# Patient Record
Sex: Female | Born: 1955 | Race: White | Hispanic: No | Marital: Married | Smoking: Former smoker
Health system: Southern US, Community
[De-identification: ages and names within clinical notes are randomized; demographics above are authoritative.]

## PROBLEM LIST (undated history)

## (undated) DIAGNOSIS — D0359 Melanoma in situ of other part of trunk: Secondary | ICD-10-CM

## (undated) DIAGNOSIS — K219 Gastro-esophageal reflux disease without esophagitis: Secondary | ICD-10-CM

## (undated) DIAGNOSIS — M1990 Unspecified osteoarthritis, unspecified site: Secondary | ICD-10-CM

## (undated) HISTORY — PX: CHOLECYSTECTOMY: SHX55

## (undated) HISTORY — DX: Gastro-esophageal reflux disease without esophagitis: K21.9

## (undated) HISTORY — PX: KIDNEY STONE SURGERY: SHX686

## (undated) HISTORY — DX: Melanoma in situ of other part of trunk: D03.59

---

## 2003-02-19 HISTORY — PX: CHOLECYSTECTOMY, LAPAROSCOPIC: SHX56

## 2005-02-18 HISTORY — PX: SALPINGOOPHORECTOMY: SHX82

## 2017-09-10 ENCOUNTER — Other Ambulatory Visit: Payer: Self-pay | Admitting: Advanced Practice Midwife

## 2017-09-10 DIAGNOSIS — Z1231 Encounter for screening mammogram for malignant neoplasm of breast: Secondary | ICD-10-CM

## 2017-09-12 ENCOUNTER — Ambulatory Visit (INDEPENDENT_AMBULATORY_CARE_PROVIDER_SITE_OTHER): Payer: 59

## 2017-09-12 DIAGNOSIS — Z1231 Encounter for screening mammogram for malignant neoplasm of breast: Secondary | ICD-10-CM

## 2017-09-22 ENCOUNTER — Ambulatory Visit (INDEPENDENT_AMBULATORY_CARE_PROVIDER_SITE_OTHER): Payer: 59 | Admitting: Advanced Practice Midwife

## 2017-09-22 ENCOUNTER — Encounter: Payer: Self-pay | Admitting: Advanced Practice Midwife

## 2017-09-22 DIAGNOSIS — D0359 Melanoma in situ of other part of trunk: Secondary | ICD-10-CM

## 2017-09-22 DIAGNOSIS — Z01419 Encounter for gynecological examination (general) (routine) without abnormal findings: Secondary | ICD-10-CM | POA: Diagnosis not present

## 2017-09-22 DIAGNOSIS — Z87442 Personal history of urinary calculi: Secondary | ICD-10-CM

## 2017-09-22 DIAGNOSIS — Z780 Asymptomatic menopausal state: Secondary | ICD-10-CM

## 2017-09-22 DIAGNOSIS — K649 Unspecified hemorrhoids: Secondary | ICD-10-CM

## 2017-09-25 ENCOUNTER — Encounter: Payer: Self-pay | Admitting: Advanced Practice Midwife

## 2017-09-25 DIAGNOSIS — D0359 Melanoma in situ of other part of trunk: Secondary | ICD-10-CM | POA: Insufficient documentation

## 2017-09-25 DIAGNOSIS — K649 Unspecified hemorrhoids: Secondary | ICD-10-CM | POA: Insufficient documentation

## 2017-09-25 DIAGNOSIS — Z87442 Personal history of urinary calculi: Secondary | ICD-10-CM | POA: Insufficient documentation

## 2017-09-25 DIAGNOSIS — Z780 Asymptomatic menopausal state: Secondary | ICD-10-CM | POA: Insufficient documentation

## 2017-09-25 NOTE — Patient Instructions (Signed)
Health Maintenance for Postmenopausal Women Menopause is a normal process in which your reproductive ability comes to an end. This process happens gradually over a span of months to years, usually between the ages of 22 and 9. Menopause is complete when you have missed 12 consecutive menstrual periods. It is important to talk with your health care provider about some of the most common conditions that affect postmenopausal women, such as heart disease, cancer, and bone loss (osteoporosis). Adopting a healthy lifestyle and getting preventive care can help to promote your health and wellness. Those actions can also lower your chances of developing some of these common conditions. What should I know about menopause? During menopause, you may experience a number of symptoms, such as:  Moderate-to-severe hot flashes.  Night sweats.  Decrease in sex drive.  Mood swings.  Headaches.  Tiredness.  Irritability.  Memory problems.  Insomnia.  Choosing to treat or not to treat menopausal changes is an individual decision that you make with your health care provider. What should I know about hormone replacement therapy and supplements? Hormone therapy products are effective for treating symptoms that are associated with menopause, such as hot flashes and night sweats. Hormone replacement carries certain risks, especially as you become older. If you are thinking about using estrogen or estrogen with progestin treatments, discuss the benefits and risks with your health care provider. What should I know about heart disease and stroke? Heart disease, heart attack, and stroke become more likely as you age. This may be due, in part, to the hormonal changes that your body experiences during menopause. These can affect how your body processes dietary fats, triglycerides, and cholesterol. Heart attack and stroke are both medical emergencies. There are many things that you can do to help prevent heart disease  and stroke:  Have your blood pressure checked at least every 1-2 years. High blood pressure causes heart disease and increases the risk of stroke.  If you are 53-22 years old, ask your health care provider if you should take aspirin to prevent a heart attack or a stroke.  Do not use any tobacco products, including cigarettes, chewing tobacco, or electronic cigarettes. If you need help quitting, ask your health care provider.  It is important to eat a healthy diet and maintain a healthy weight. ? Be sure to include plenty of vegetables, fruits, low-fat dairy products, and lean protein. ? Avoid eating foods that are high in solid fats, added sugars, or salt (sodium).  Get regular exercise. This is one of the most important things that you can do for your health. ? Try to exercise for at least 150 minutes each week. The type of exercise that you do should increase your heart rate and make you sweat. This is known as moderate-intensity exercise. ? Try to do strengthening exercises at least twice each week. Do these in addition to the moderate-intensity exercise.  Know your numbers.Ask your health care provider to check your cholesterol and your blood glucose. Continue to have your blood tested as directed by your health care provider.  What should I know about cancer screening? There are several types of cancer. Take the following steps to reduce your risk and to catch any cancer development as early as possible. Breast Cancer  Practice breast self-awareness. ? This means understanding how your breasts normally appear and feel. ? It also means doing regular breast self-exams. Let your health care provider know about any changes, no matter how small.  If you are 40  or older, have a clinician do a breast exam (clinical breast exam or CBE) every year. Depending on your age, family history, and medical history, it may be recommended that you also have a yearly breast X-ray (mammogram).  If you  have a family history of breast cancer, talk with your health care provider about genetic screening.  If you are at high risk for breast cancer, talk with your health care provider about having an MRI and a mammogram every year.  Breast cancer (BRCA) gene test is recommended for women who have family members with BRCA-related cancers. Results of the assessment will determine the need for genetic counseling and BRCA1 and for BRCA2 testing. BRCA-related cancers include these types: ? Breast. This occurs in males or females. ? Ovarian. ? Tubal. This may also be called fallopian tube cancer. ? Cancer of the abdominal or pelvic lining (peritoneal cancer). ? Prostate. ? Pancreatic.  Cervical, Uterine, and Ovarian Cancer Your health care provider may recommend that you be screened regularly for cancer of the pelvic organs. These include your ovaries, uterus, and vagina. This screening involves a pelvic exam, which includes checking for microscopic changes to the surface of your cervix (Pap test).  For women ages 21-65, health care providers may recommend a pelvic exam and a Pap test every three years. For women ages 79-65, they may recommend the Pap test and pelvic exam, combined with testing for human papilloma virus (HPV), every five years. Some types of HPV increase your risk of cervical cancer. Testing for HPV may also be done on women of any age who have unclear Pap test results.  Other health care providers may not recommend any screening for nonpregnant women who are considered low risk for pelvic cancer and have no symptoms. Ask your health care provider if a screening pelvic exam is right for you.  If you have had past treatment for cervical cancer or a condition that could lead to cancer, you need Pap tests and screening for cancer for at least 20 years after your treatment. If Pap tests have been discontinued for you, your risk factors (such as having a new sexual partner) need to be  reassessed to determine if you should start having screenings again. Some women have medical problems that increase the chance of getting cervical cancer. In these cases, your health care provider may recommend that you have screening and Pap tests more often.  If you have a family history of uterine cancer or ovarian cancer, talk with your health care provider about genetic screening.  If you have vaginal bleeding after reaching menopause, tell your health care provider.  There are currently no reliable tests available to screen for ovarian cancer.  Lung Cancer Lung cancer screening is recommended for adults 69-62 years old who are at high risk for lung cancer because of a history of smoking. A yearly low-dose CT scan of the lungs is recommended if you:  Currently smoke.  Have a history of at least 30 pack-years of smoking and you currently smoke or have quit within the past 15 years. A pack-year is smoking an average of one pack of cigarettes per day for one year.  Yearly screening should:  Continue until it has been 15 years since you quit.  Stop if you develop a health problem that would prevent you from having lung cancer treatment.  Colorectal Cancer  This type of cancer can be detected and can often be prevented.  Routine colorectal cancer screening usually begins at  age 42 and continues through age 45.  If you have risk factors for colon cancer, your health care provider may recommend that you be screened at an earlier age.  If you have a family history of colorectal cancer, talk with your health care provider about genetic screening.  Your health care provider may also recommend using home test kits to check for hidden blood in your stool.  A small camera at the end of a tube can be used to examine your colon directly (sigmoidoscopy or colonoscopy). This is done to check for the earliest forms of colorectal cancer.  Direct examination of the colon should be repeated every  5-10 years until age 71. However, if early forms of precancerous polyps or small growths are found or if you have a family history or genetic risk for colorectal cancer, you may need to be screened more often.  Skin Cancer  Check your skin from head to toe regularly.  Monitor any moles. Be sure to tell your health care provider: ? About any new moles or changes in moles, especially if there is a change in a mole's shape or color. ? If you have a mole that is larger than the size of a pencil eraser.  If any of your family members has a history of skin cancer, especially at a young age, talk with your health care provider about genetic screening.  Always use sunscreen. Apply sunscreen liberally and repeatedly throughout the day.  Whenever you are outside, protect yourself by wearing long sleeves, pants, a wide-brimmed hat, and sunglasses.  What should I know about osteoporosis? Osteoporosis is a condition in which bone destruction happens more quickly than new bone creation. After menopause, you may be at an increased risk for osteoporosis. To help prevent osteoporosis or the bone fractures that can happen because of osteoporosis, the following is recommended:  If you are 46-71 years old, get at least 1,000 mg of calcium and at least 600 mg of vitamin D per day.  If you are older than age 55 but younger than age 65, get at least 1,200 mg of calcium and at least 600 mg of vitamin D per day.  If you are older than age 54, get at least 1,200 mg of calcium and at least 800 mg of vitamin D per day.  Smoking and excessive alcohol intake increase the risk of osteoporosis. Eat foods that are rich in calcium and vitamin D, and do weight-bearing exercises several times each week as directed by your health care provider. What should I know about how menopause affects my mental health? Depression may occur at any age, but it is more common as you become older. Common symptoms of depression  include:  Low or sad mood.  Changes in sleep patterns.  Changes in appetite or eating patterns.  Feeling an overall lack of motivation or enjoyment of activities that you previously enjoyed.  Frequent crying spells.  Talk with your health care provider if you think that you are experiencing depression. What should I know about immunizations? It is important that you get and maintain your immunizations. These include:  Tetanus, diphtheria, and pertussis (Tdap) booster vaccine.  Influenza every year before the flu season begins.  Pneumonia vaccine.  Shingles vaccine.  Your health care provider may also recommend other immunizations. This information is not intended to replace advice given to you by your health care provider. Make sure you discuss any questions you have with your health care provider. Document Released: 03/29/2005  Document Revised: 08/25/2015 Document Reviewed: 11/08/2014 Elsevier Interactive Patient Education  2018 [employer]  

## 2017-09-25 NOTE — Progress Notes (Signed)
GYNECOLOGY ANNUAL PREVENTATIVE CARE ENCOUNTER NOTE  Subjective:   Teresa Morton is a 62 y.o. G58P3000 female here for a routine annual gynecologic exam.  Current complaints: none.  She is postmenopausal.  Has had a recent pap in 2018 which was normal.   Denies abnormal vaginal bleeding, discharge, pelvic pain, problems with intercourse or other gynecologic concerns.   Just moved here from [place].  Has family nearby.  Scheduled to see Primary Care on 09/30/17      Gynecologic History No LMP recorded. Patient is postmenopausal. Contraception: post menopausal status Last Pap: 2018. Results were: normal Last mammogram: 09/12/17. Results were: normal  Obstetric History OB History  Gravida Para Term Preterm AB Living  3 3 3         SAB TAB Ectopic Multiple Live Births               # Outcome Date GA Lbr Len/2nd Weight Sex Delivery Anes PTL Lv  3 Term      Vag-Spont     2 Term      Vag-Spont     1 Term      Vag-Spont       Medical History Patient Active Problem List   Diagnosis Date Noted  . Postmenopausal 09/25/2017  . History of kidney stones 09/25/2017  . 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 Hemorrhoids 09/25/2017   Surgical History Cholecystectomy 2005 Salpingoopherectomy 2007   Current Outpatient Medications on File Prior to Visit  Medication Sig Dispense Refill  . Biotin 1000 MCG tablet Take 1,000 mcg by mouth 3 (three) times daily.    . cholecalciferol (VITAMIN D) 1000 units tablet Take 1,000 Units by mouth daily.     No current facility-administered medications on file prior to visit.     Allergies  Allergen Reactions  . Codeine Nausea And Vomiting    Social History   Socioeconomic History  . Marital status: Married    Spouse name: Not on file  . Number of children: Not on file  . Years of education: Not on file  . Highest education level: Not on file  Occupational History  . Occupation: Scientific laboratory technician  . Financial resource strain: Not  on file  . Food insecurity:    Worry: Not on file    Inability: Not on file  . Transportation needs:    Medical: Not on file    Non-medical: Not on file  Tobacco Use  . Smoking status: Never Smoker  . Smokeless tobacco: Never Used  Substance and Sexual Activity  . Alcohol use: Never    Frequency: Never  . Drug use: Never  . Sexual activity: Yes    Partners: Male    Birth control/protection: None  Lifestyle  . Physical activity:    Days per week: Not on file    Minutes per session: Not on file  . Stress: Not on file  Relationships  . Social connections:    Talks on phone: Not on file    Gets together: Not on file    Attends religious service: Not on file    Active member of club or organization: Not on file    Attends meetings of clubs or organizations: Not on file    Relationship status: Not on file  . Intimate partner violence:    Fear of current or ex partner: Not on file    Emotionally abused: Not on file    Physically abused: Not on file  Forced sexual activity: Not on file  Other Topics Concern  . Not on file  Social History Narrative  . Not on file    Family History  Problem Relation Age of Onset  . Liver cancer Mother   . Alcoholism Father     The following portions of the patient's history were reviewed and updated as appropriate: allergies, current medications, past family history, past medical history, past social history, past surgical history and problem list.  Review of Systems Constitutional: negative for anorexia, chills, fatigue, fevers and malaise Respiratory: negative for asthma, cough and dyspnea on exertion Cardiovascular: negative for chest pain and dyspnea Genitourinary:negative for genital lesions, hot flashes, sexual problems and vaginal discharge   Objective:  BP 100/61   Pulse 75   Resp 16   Ht 5\' 5"  (1.651 m)   Wt 67.1 kg   BMI 24.63 kg/m  CONSTITUTIONAL: Well-developed, well-nourished female in no acute distress.  HENT:   Normocephalic, atraumatic NECK: Normal range of motion, supple, no masses.  Normal thyroid.  SKIN: Skin is warm and dry. No rash noted. Not diaphoretic. No erythema. No pallor. NEUROLOGIC: Alert and oriented to person, place, and time. PSYCHIATRIC: Normal mood and affect. Normal behavior. Normal judgment and thought content. CARDIOVASCULAR: Normal heart rate noted, regular rhythm RESPIRATORY: Clear to auscultation bilaterally. Effort and breath sounds normal, no problems with respiration noted. BREASTS: Symmetric in size. No masses, skin changes, nipple drainage, or lymphadenopathy. ABDOMEN: Soft, normal bowel sounds, no distention noted.  No tenderness, rebound or guarding.  PELVIC: Normal appearing external genitalia; normal appearing vaginal mucosa and cervix.  No abnormal discharge noted.  Pap smear not obtained.  Normal uterine size, no other palpable masses, no uterine or adnexal tenderness. MUSCULOSKELETAL: Normal range of motion. No tenderness.  No cyanosis, clubbing, or edema.    Assessment:  Annual gynecologic examination without pap smear (done 2018)   Plan:  Will repeat pap in 2 years Mammogram results reviewed Routine preventative health maintenance measures emphasized. Has appt with primary care next week Please refer to After Visit Summary for other counseling recommendations.

## 2017-09-30 ENCOUNTER — Telehealth: Payer: Self-pay | Admitting: Osteopathic Medicine

## 2017-09-30 ENCOUNTER — Ambulatory Visit (INDEPENDENT_AMBULATORY_CARE_PROVIDER_SITE_OTHER): Payer: 59 | Admitting: Osteopathic Medicine

## 2017-09-30 ENCOUNTER — Encounter: Payer: Self-pay | Admitting: Osteopathic Medicine

## 2017-09-30 VITALS — BP 108/70 | HR 78 | Temp 98.3°F | Ht 66.0 in | Wt 146.1 lb

## 2017-09-30 DIAGNOSIS — Z1211 Encounter for screening for malignant neoplasm of colon: Secondary | ICD-10-CM

## 2017-09-30 DIAGNOSIS — Z0000 Encounter for general adult medical examination without abnormal findings: Secondary | ICD-10-CM

## 2017-09-30 NOTE — Patient Instructions (Addendum)
General Preventive Care  Most recent routine screening lipids/other labs: ordered today   Tobacco: don't! Alcohol: moderation is ok for most people. Recreational/Illicit Drugs: don't!  Exercise: as tolerated to reduce risk of cardiovascular disease and diabetes.   Mental health: if need for mental health care (medicines, counseling, other), or concerns about moods, please let me know!   Sexual health: if need for pregnancy prevention or STD testing, please let me know!   Vaccines  Flu vaccine: recommended every fall (by Halloween!)  Shingles vaccine: Shingrix recommended after age 13, will call you once this is available.   Pneumonia vaccines: Prevnar and Pneumovax recommended after age 34, sooner if diabetes, COPD/asthma, others  Tetanus booster: Tdap recommended every 10 years, will confirm last vaccine date if we can  Cancer screenings   Colon cancer screening: recommended starting at age 42, will refer for colonoscopy   Breast cancer screening: mammogram recommended annually starting at age 36-50  Cervical cancer screening: every 1 to 5 years depending on age and other risk factors. Can stop at age 27 or w/ hysterectomy as long as previous testing was normal.   Other preventive care:  Marland Kitchen Bone Density Test: recommended for women at age 69, men at age 28, sooner depending on risk factors . Advanced Directive: Living Will and/or Healthcare Power of Attorney recommended for everyone, regardless of age or health . Cholesterol & Diabetes: recommended screening annually . Thyroid and Vitamin D: routine screening not recommended, most insurance will not cover this test   Sleep:  If melatonin not helping, call/message me and we can try Rx for Trazodone

## 2017-09-30 NOTE — Progress Notes (Signed)
HPI: Teresa Morton is a 63 y.o. 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  she presents to [facility] today, 09/30/17,  for chief complaint of: New to establish  Just moved here from [place]. Already had visit with OBGYN last week. Postmenopausal, G3P3. Normal Pap 2018, planning to repeat in 3 years. Normal mammo this year 08/2017.   Needle phobic: declines vaccines for now, will confirm last tetanus   Sleep problems: not sleeping as well lately, tossing and turning   Preventive care reviewed below      Past medical, surgical, social and family history reviewed:  Patient Active Problem List   Diagnosis Date Noted  . Postmenopausal 09/25/2017  . History of kidney stones 09/25/2017  . 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 Hemorrhoids 09/25/2017    Past Surgical History:  Procedure Laterality Date  . CHOLECYSTECTOMY, LAPAROSCOPIC  2005  . SALPINGOOPHORECTOMY  2007    Social History   Tobacco Use  . Smoking status: Former Research scientist (life sciences)  . Smokeless tobacco: Never Used  Substance Use Topics  . Alcohol use: Never    Frequency: Never    Family History  Problem Relation Age of Onset  . Liver cancer Mother   . Alcoholism Father      Current medication list and allergy/intolerance information reviewed:    Current Outpatient Medications  Medication Sig Dispense Refill  . Biotin 1000 MCG tablet Take 1,000 mcg by mouth 3 (three) times daily.    . cholecalciferol (VITAMIN D) 1000 units tablet Take 1,000 Units by mouth daily.     No current facility-administered medications for this visit.     Allergies  Allergen Reactions  . Codeine Nausea And Vomiting      Review of Systems:  Constitutional:  No  fever, no chills, No recent illness, No unintentional weight changes. No significant fatigue.   HEENT: No  headache, no vision change, no hearing  change, No sore throat, No  sinus pressure  Cardiac: No  chest pain, No  pressure, No palpitations, No  Orthopnea  Respiratory:  No  shortness of breath. No  Cough  Gastrointestinal: No  abdominal pain, No  nausea, No  vomiting,  No  blood in stool, No  diarrhea, No  constipation   Musculoskeletal: No new myalgia/arthralgia  Skin: No  Rash, No other wounds/concerning lesions  Genitourinary: No  incontinence, No  abnormal genital bleeding, No abnormal genital discharge  Hem/Onc: No  easy bruising/bleeding, No  abnormal lymph node  Endocrine: No cold intolerance,  No heat intolerance. No polyuria/polydipsia/polyphagia   Neurologic: No  weakness, No  dizziness, No  slurred speech/focal weakness/facial droop  Psychiatric: No  concerns with depression, No  concerns with anxiety, +sleep problems, No mood problems  Depression screen [facility] 2/9 09/30/2017  Decreased Interest 0  Down, Depressed, Hopeless 0  PHQ - 2 Score 0  Altered sleeping 3  Tired, decreased energy 2  Change in appetite 0  Feeling bad or failure about yourself  0  Trouble concentrating 0  Moving slowly or fidgety/restless 0  Suicidal thoughts 0  PHQ-9 Score 5  Difficult doing work/chores Somewhat difficult   GAD 7 : Generalized Anxiety Score 09/30/2017  Nervous, Anxious, on Edge 0  Control/stop worrying 0  Worry too much - different things 0  Trouble relaxing 0  Restless 0  Easily annoyed or irritable 0  Afraid - awful might  happen 0  Total GAD 7 Score 0      Exam:  BP 108/70 (BP Location: Left Arm, Patient Position: Sitting, Cuff Size: Normal)   Pulse 78   Temp 98.3 F (36.8 C) (Oral)   Ht 5\' 6"  (1.676 m)   Wt 146 lb 1.6 oz (66.3 kg)   BMI 23.58 kg/m   Constitutional: VS see above. General Appearance: alert, well-developed, well-nourished, NAD  Eyes: Normal lids and conjunctive, non-icteric sclera  Ears, Nose, Mouth, Throat: MMM, Normal external inspection ears/nares/mouth/lips/gums. TM normal  bilaterally. Pharynx/tonsils no erythema, no exudate. Nasal mucosa normal.   Neck: No masses, trachea midline. No thyroid enlargement. No tenderness/mass appreciated. No lymphadenopathy  Respiratory: Normal respiratory effort. no wheeze, no rhonchi, no rales  Cardiovascular: S1/S2 normal, no murmur, no rub/gallop auscultated. RRR. No lower extremity edema. Pedal pulse II/IV bilaterally DP and PT. No carotid bruit or JVD. No abdominal aortic bruit.  Gastrointestinal: Nontender, no masses. No hepatomegaly, no splenomegaly. No hernia appreciated. Bowel sounds normal. Rectal exam deferred.   Musculoskeletal: Gait normal. No clubbing/cyanosis of digits.   Neurological: Normal balance/coordination. No tremor. No cranial nerve deficit on limited exam. Motor and sensation intact and symmetric. Cerebellar reflexes intact.   Skin: warm, dry, intact. No rash/ulcer. No concerning nevi or subq nodules on limited exam.    Psychiatric: Normal judgment/insight. Normal mood and affect. Oriented x3.      ASSESSMENT/PLAN:   Annual physical exam - Plan: CBC, COMPLETE METABOLIC PANEL WITH GFR, Lipid panel  Colon cancer screening - Plan: Ambulatory referral to Gastroenterology    Patient Instructions  General Preventive Care  Most recent routine screening lipids/other labs: ordered today   Tobacco: don't! Alcohol: moderation is ok for most people. Recreational/Illicit Drugs: don't!  Exercise: as tolerated to reduce risk of cardiovascular disease and diabetes.   Mental health: if need for mental health care (medicines, counseling, other), or concerns about moods, please let me know!   Sexual health: if need for pregnancy prevention or STD testing, please let me know!   Vaccines  Flu vaccine: recommended every fall (by Halloween!)  Shingles vaccine: Shingrix recommended after age 34, will call you once this is available.   Pneumonia vaccines: Prevnar and Pneumovax recommended after age 19,  sooner if diabetes, COPD/asthma, others  Tetanus booster: Tdap recommended every 10 years, will confirm last vaccine date if we can  Cancer screenings   Colon cancer screening: recommended starting at age 23, will refer for colonoscopy   Breast cancer screening: mammogram recommended annually starting at age 14-50  Cervical cancer screening: every 1 to 5 years depending on age and other risk factors. Can stop at age 72 or w/ hysterectomy as long as previous testing was normal.   Other preventive care:  Marland Kitchen Bone Density Test: recommended for women at age 51, men at age 79, sooner depending on risk factors . Advanced Directive: Living Will and/or Healthcare Power of Attorney recommended for everyone, regardless of age or health . Cholesterol & Diabetes: recommended screening annually . Thyroid and Vitamin D: routine screening not recommended, most insurance will not cover this test   Sleep:  If melatonin not helping, call/message me and we can try Rx for Trazodone          Visit summary with medication list and pertinent instructions was printed for patient to review. All questions at time of visit were answered - patient instructed to contact office with any additional concerns. ER/RTC precautions were reviewed with the patient.  Follow-up plan: Return in about 1 year (around 10/01/2018) for annual physical, sooner if needed! .   Please note: voice recognition software was used to produce this document, and typos may escape review. Please contact Dr. Sheppard Coil for any needed clarifications.

## 2017-09-30 NOTE — Telephone Encounter (Signed)
-----   Message from Emeterio Reeve, DO sent at 09/30/2017  9:53 AM EDT ----- Regarding: shingrix shingrix list

## 2017-09-30 NOTE — Telephone Encounter (Signed)
Added

## 2017-10-01 ENCOUNTER — Encounter: Payer: Self-pay | Admitting: Gastroenterology

## 2017-10-07 ENCOUNTER — Encounter: Payer: Self-pay | Admitting: Gastroenterology

## 2017-10-07 ENCOUNTER — Ambulatory Visit (AMBULATORY_SURGERY_CENTER): Payer: Self-pay | Admitting: *Deleted

## 2017-10-07 VITALS — Ht 66.0 in | Wt 147.0 lb

## 2017-10-07 DIAGNOSIS — Z1211 Encounter for screening for malignant neoplasm of colon: Secondary | ICD-10-CM

## 2017-10-07 MED ORDER — NA SULFATE-K SULFATE-MG SULF 17.5-3.13-1.6 GM/177ML PO SOLN: 1.0000 | Freq: Once | ORAL | 0 refills | Status: DC

## 2017-10-07 MED ORDER — NA SULFATE-K SULFATE-MG SULF 17.5-3.13-1.6 GM/177ML PO SOLN: 1.0000 | Freq: Once | ORAL | 0 refills | Status: AC

## 2017-10-07 NOTE — Progress Notes (Signed)
Denies allergies to eggs or soy products. Denies complications with sedation or anesthesia. Denies O2 use. Denies use of diet or weight loss medications.  Emmi instructions given for colonoscopy.  

## 2017-10-09 ENCOUNTER — Other Ambulatory Visit: Payer: Self-pay

## 2017-10-09 ENCOUNTER — Telehealth: Payer: Self-pay | Admitting: Emergency Medicine

## 2017-10-09 ENCOUNTER — Telehealth: Payer: Self-pay

## 2017-10-09 ENCOUNTER — Emergency Department (INDEPENDENT_AMBULATORY_CARE_PROVIDER_SITE_OTHER)
Admission: EM | Admit: 2017-10-09 | Discharge: 2017-10-09 | Disposition: A | Discharge status: Home / Self Care | Payer: 59 | Source: Home / Self Care | Attending: Family Medicine | Admitting: Family Medicine

## 2017-10-09 ENCOUNTER — Emergency Department (INDEPENDENT_AMBULATORY_CARE_PROVIDER_SITE_OTHER): Payer: 59

## 2017-10-09 DIAGNOSIS — N200 Calculus of kidney: Secondary | ICD-10-CM | POA: Diagnosis not present

## 2017-10-09 DIAGNOSIS — N1330 Unspecified hydronephrosis: Secondary | ICD-10-CM | POA: Diagnosis not present

## 2017-10-09 DIAGNOSIS — N202 Calculus of kidney with calculus of ureter: Secondary | ICD-10-CM

## 2017-10-09 LAB — POCT URINALYSIS DIP (MANUAL ENTRY)
GLUCOSE UA: NEGATIVE mg/dL
NITRITE UA: NEGATIVE
PH UA: 5.5 (ref 5.0–8.0)
Protein Ur, POC: 100 mg/dL — AB
Spec Grav, UA: >=1.03 — AB (ref 1.010–1.025)
Urobilinogen, UA: 0.2 E.U./dL

## 2017-10-09 LAB — POCT CBC W AUTO DIFF (K'VILLE URGENT CARE)

## 2017-10-09 MED ORDER — KETOROLAC TROMETHAMINE 60 MG/2ML IM SOLN
60.0000 mg | Freq: Once | INTRAMUSCULAR | Status: AC
  Administered 2017-10-09: 60 mg via INTRAMUSCULAR

## 2017-10-09 MED ORDER — ONDANSETRON 4 MG PO TBDP
4.0000 mg | ORAL_TABLET | Freq: Once | ORAL | Status: AC
  Administered 2017-10-09: 4 mg via ORAL

## 2017-10-09 MED ORDER — ONDANSETRON 4 MG PO TBDP: ORAL_TABLET | ORAL | 0 refills | Status: DC

## 2017-10-09 NOTE — Telephone Encounter (Signed)
Add order

## 2017-10-09 NOTE — ED Notes (Signed)
Add Urine Culture

## 2017-10-09 NOTE — ED Provider Notes (Signed)
[facility]    CSN: [id number] Arrival date & time: 10/09/17  1339     History   Chief Complaint Chief Complaint  Patient presents with  . Back Pain  . Emesis    HPI Andrew Breese is a 62 y.o. female.   Patient was awakened at 5:30AM today by severe left CVA/flank tenderness and nausea/vomiting.  The pain lasted about 2 hours after taking ibuprofen 400mg .  The pain recurred at about 9AM, radiating somewhat to her mid-abdomen. She denies urinary symptoms, although her urine has been darker, and bowel movements have been normal.  She denies fevers, chills, and sweats.  She recalls no injury to the area.  No history of kidney stones.                                                                                                                                                     The history is provided by the spouse.  Flank Pain  This is a new problem. The current episode started 6 to 12 hours ago. The problem occurs constantly. The problem has not changed since onset.Associated symptoms include abdominal pain. Nothing aggravates the symptoms. Nothing relieves the symptoms. Treatments tried: Ibuprofen. The treatment provided mild relief.    Past Medical History:  Diagnosis Date  . GERD (gastroesophageal reflux disease)   . Melanoma in situ of trunk [facility])     Patient Active Problem List   Diagnosis Date Noted  . Postmenopausal 09/25/2017  . History of kidney stones 09/25/2017  . 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 Hemorrhoids 09/25/2017    Past Surgical History:  Procedure Laterality Date  . CHOLECYSTECTOMY, LAPAROSCOPIC  2005  . SALPINGOOPHORECTOMY  2007    OB History    Gravida  3   Para  3   Term  3   Preterm      AB      Living        SAB      TAB      Ectopic      Multiple      Live Births               Home Medications    Prior to Admission medications   Medication Sig Start Date End Date Taking? Authorizing  Provider  Biotin 1000 MCG tablet Take 1,000 mcg by mouth 3 (three) times daily.    [provider]  cholecalciferol (VITAMIN D) 1000 units tablet Take 1,000 Units by mouth daily.    [provider]  ondansetron (ZOFRAN ODT) 4 MG disintegrating tablet Take one tab by mouth Q6hr prn nausea.  Dissolve under tongue. 10/09/17   Kandra Nicolas, MD    Family History Family History  Problem Relation Age of Onset  . Liver cancer Mother   .  Alcoholism Father   . Colon cancer Neg Hx   . Esophageal cancer Neg Hx   . Rectal cancer Neg Hx   . Stomach cancer Neg Hx     Social History Social History   Tobacco Use  . Smoking status: Former Smoker    Last attempt to quit: 1984    Years since quitting: 35.6  . Smokeless tobacco: Never Used  Substance Use Topics  . Alcohol use: Never    Frequency: Never  . Drug use: Never     Allergies   Codeine   Review of Systems Review of Systems  Constitutional: Positive for activity change, appetite change and fatigue. Negative for chills, diaphoresis and fever.  HENT: Negative.   Eyes: Negative.   Respiratory: Negative.   Cardiovascular: Negative.   Gastrointestinal: Positive for abdominal pain, nausea and vomiting. Negative for blood in stool, constipation and diarrhea.  Genitourinary: Positive for flank pain. Negative for difficulty urinating, dysuria, frequency, hematuria, pelvic pain, urgency, vaginal bleeding and vaginal pain.  Musculoskeletal: Positive for back pain.  Skin: Negative.      Physical Exam Triage Vital Signs ED Triage Vitals [10/09/17 1349]  Enc Vitals Group     BP (!) 182/94     Pulse Rate 72     Resp      Temp 97.6 F (36.4 C)     Temp Source Oral     SpO2 96 %     Weight 146 lb (66.2 kg)     Height 5\' 7"  (1.702 m)     Head Circumference      Peak Flow      Pain Score 10     Pain Loc      Pain Edu?      Excl. in [place]?    No data found.  Updated Vital Signs BP (!) 182/94 (BP Location:  Right Arm)   Pulse 72   Temp 97.6 F (36.4 C) (Oral)   Ht 5\' 7"  (1.702 m)   Wt 66.2 kg   SpO2 96%   BMI 22.87 kg/m   Visual Acuity Right Eye Distance:   Left Eye Distance:   Bilateral Distance:    Right Eye Near:   Left Eye Near:    Bilateral Near:     Physical Exam  Abdominal: She exhibits no distension and no mass. Bowel sounds are decreased. There is no hepatosplenomegaly. There is tenderness in the left upper quadrant. There is no tenderness at McBurney's point.    There is mild tenderness to palpation left upper quadrant as noted on diagram.   Skin:     There is distinct tenderness to palpation over the left CVA.     UC Treatments / Results  Labs (all labs ordered are listed, but only abnormal results are displayed) Labs Reviewed  COMPLETE METABOLIC PANEL WITH GFR  POCT CBC W AUTO DIFF ([facility]):  WBC 5.5; LY 35.2; MO 7.5; GR 57.3; Hgb 14.0; Platelets 244   POCT URINALYSIS DIP (MANUAL ENTRY) small BIL; trace KET; SG >= 1.030 ; BLO large; PRO 100mg /dL; LEU small    EKG None  Radiology Ct Renal Stone Study  Result Date: 10/09/2017 CLINICAL DATA:  Sudden onset of left flank pain this morning, hematuria EXAM: CT ABDOMEN AND PELVIS WITHOUT CONTRAST TECHNIQUE: Multidetector CT imaging of the abdomen and pelvis was performed following the standard protocol without IV contrast. COMPARISON:  None. FINDINGS: Lower chest: The lung bases are clear. The heart is within normal limits  in size. No pericardial effusion is seen. Hepatobiliary: The liver is unremarkable in the unenhanced state. Surgical clips are present from prior cholecystectomy. Pancreas: The pancreas is normal in size and the pancreatic duct is not dilated. Spleen: The spleen is unremarkable with small splenule is medially. Adrenals/Urinary Tract: The adrenal glands are unremarkable. There appear to be 2 small adjacent calculi in the lower pole of the left kidney measuring 3 mm in diameter versus 1  larger stone. No right renal calculi are seen. There is mild fullness of the left pelvocaliceal system and left ureter to the bladder. Very near the expected left UV junction, there is a 1-2 mm calcific density present consistent with recently passed calculus seen on image 76 series 2. The urinary bladder is completely decompressed and difficult to evaluate. The right ureter is normal in caliber. Stomach/Bowel: The stomach is moderately distended with fluid. No small bowel abnormality is seen. There are a few scattered rectosigmoid colon diverticula but there is no evidence of diverticulitis. Otherwise the colon is unremarkable. The terminal ileum and the appendix appear normal as well. Vascular/Lymphatic: The abdominal aorta is normal in caliber with minimal abdominal aortic atherosclerotic change present. No adenopathy is seen. Reproductive: The uterus is normal in size. No adnexal lesion is seen. No fluid is noted within the pelvis. Other: No abdominal wall hernia is seen. Musculoskeletal: 5 mm anterolisthesis of L5 on S1 with bilateral pars defects at L5. Degenerative disc disease at L5-S1 is noted. The SI joints are corticated. IMPRESSION: 1. Mild left hydronephrosis and left hydroureter to the decompressed urinary bladder, where there is suggestion of a small 1-2 mm calculus within the compressed urinary bladder, most likely recently passed. 2. At least 2 small lower pole adjacent renal calculi approximately 3 mm in diameter. No right renal calculi noted. 3. 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 Also degenerative disc disease at L5-S1. Electronically Signed   By: Ivar Drape M.D.   On: 10/09/2017 15:11    Procedures Procedures (including critical care time)  Medications Ordered in UC Medications  ondansetron (ZOFRAN-ODT) disintegrating tablet 4 mg (4 mg Oral Given 10/09/17 1358)    Initial Impression / Assessment and Plan / UC Course  I have reviewed the triage vital  signs and the nursing notes.  Pertinent labs & imaging results that were available during my care of the patient were reviewed by me and considered in my medical decision making (see chart for details).    Administered Zofran ODT 4mg  PO.  Administered Toradol 60mg  IM.  Patient assymptomatic at time of discharge. Followup with Family Doctor if not improved in about 4 to 5 days.   Final Clinical Impressions(s) / UC Diagnoses   Final diagnoses:  Left nephrolithiasis     Discharge Instructions     Strain urine to collect stone, if possible, for stone analysis.  If stone collected may return to urgent care or PCP for stone analysis. Increase fluid intake.  May take Ibuprofen 200mg , 4 tabs every 8 hours with food.  If symptoms become significantly worse during the night or over the weekend, proceed to the local emergency room.     ED Prescriptions    Medication Sig Dispense Auth. Provider   ondansetron (ZOFRAN ODT) 4 MG disintegrating tablet Take one tab by mouth Q6hr prn nausea.  Dissolve under tongue. 12 tablet Kandra Nicolas, MD         Kandra Nicolas, MD 10/09/17 [phone]

## 2017-10-09 NOTE — ED Triage Notes (Signed)
Pt woke up at 5 am today with mid/uper left sided back pain.  Vomited at 5:30.  Took advil, and pain resolved.  Started having pain and vomiting again about 1 hour ago.

## 2017-10-09 NOTE — Discharge Instructions (Addendum)
Strain urine to collect stone, if possible, for stone analysis. Increase fluid intake.  May take Ibuprofen 200mg , 4 tabs every 8 hours with food.  If symptoms become significantly worse during the night or over the weekend, proceed to the local emergency room.

## 2017-10-10 LAB — COMPLETE METABOLIC PANEL WITH GFR
AG Ratio: 1.5 (calc) (ref 1.0–2.5)
ALBUMIN MSPROF: 4.4 g/dL (ref 3.6–5.1)
ALT: 20 U/L (ref 6–29)
AST: 17 U/L (ref 10–35)
Alkaline phosphatase (APISO): 59 U/L (ref 33–130)
BILIRUBIN TOTAL: 0.8 mg/dL (ref 0.2–1.2)
BUN / CREAT RATIO: 17 (calc) (ref 6–22)
BUN: 19 mg/dL (ref 7–25)
CHLORIDE: 105 mmol/L (ref 98–110)
CO2: 22 mmol/L (ref 20–32)
Calcium: 9.8 mg/dL (ref 8.6–10.4)
Creat: 1.12 mg/dL — ABNORMAL HIGH (ref 0.50–0.99)
GFR, EST AFRICAN AMERICAN: 62 mL/min/{1.73_m2} (ref >=60)
GFR, Est Non African American: 53 mL/min/{1.73_m2} — ABNORMAL LOW (ref >=60)
GLOBULIN: 2.9 g/dL (ref 1.9–3.7)
Glucose, Bld: 152 mg/dL — ABNORMAL HIGH (ref 65–99)
Potassium: 3.9 mmol/L (ref 3.5–5.3)
SODIUM: 142 mmol/L (ref 135–146)
Total Protein: 7.3 g/dL (ref 6.1–8.1)

## 2017-10-11 LAB — URINE CULTURE
MICRO NUMBER:: 91009389
SPECIMEN QUALITY:: ADEQUATE

## 2017-10-12 ENCOUNTER — Telehealth: Payer: Self-pay | Admitting: Emergency Medicine

## 2017-10-13 ENCOUNTER — Telehealth: Payer: Self-pay

## 2017-10-13 MED ORDER — TRAZODONE HCL 50 MG PO TABS: 25.0000 mg | ORAL_TABLET | Freq: Every evening | ORAL | 1 refills | Status: DC | PRN

## 2017-10-13 NOTE — Telephone Encounter (Signed)
Trazodone sent to fax

## 2017-10-13 NOTE — Telephone Encounter (Signed)
Forwarding to provider for review. There is currently no active sleeping medication listed on file for pt. Thanks.

## 2017-10-13 NOTE — Telephone Encounter (Signed)
PT would like sleeping medication called in for her. She states this has been discussed already. Please call in to pharmacy on file.

## 2017-10-14 NOTE — Telephone Encounter (Signed)
Pt has been updated. No other inquiries during call.  

## 2017-10-17 ENCOUNTER — Ambulatory Visit (INDEPENDENT_AMBULATORY_CARE_PROVIDER_SITE_OTHER): Payer: 59 | Admitting: Osteopathic Medicine

## 2017-10-17 ENCOUNTER — Encounter: Payer: Self-pay | Admitting: Osteopathic Medicine

## 2017-10-17 VITALS — BP 103/77 | HR 70 | Temp 98.4°F | Wt 145.4 lb

## 2017-10-17 DIAGNOSIS — N200 Calculus of kidney: Secondary | ICD-10-CM | POA: Diagnosis not present

## 2017-10-17 NOTE — Progress Notes (Signed)
HPI: Teresa Morton is a 62 y.o. 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  she presents to [facility] today, 10/17/17,  for chief complaint of:  Follow-up ER - renal stones   Patient seen and treated in the emergency department 8 days ago, 10/09/2017, for severe left flank tenderness and nausea/vomiting.  Urinalysis showed cloudy brown urine, trace ketones, large blood, small leukocytes.  Culture grew contaminated sample, no apparent infection.  Unknown baseline creatinine but creatinine was 1.12 at that time.  CT showed mild left hydronephrosis and left hydroureter, 1 to 2 mm calculus within the bladder itself, at least 2 small lower pole adjacent renal calculi 3 mm on the left side, no right renal calculi were noted.  Incidental findings of some degenerative disc disease L5/S1 and and bilateral pars defects at L5.  Blood pressure was elevated in the ED.  She is seen today in clinic to follow-up on this diagnosis.  Unable to provide urine sample today. She is feeling well, pain is all resolved.   Brief follow-up on insomnia, the trazodone 50 mg is working well.   Past medical history, surgical history, and family history reviewed.  Current medication list and allergy/intolerance information reviewed.   (See remainder of HPI, ROS, Phys Exam below)    ASSESSMENT/PLAN:   Left nephrolithiasis - Plan: Basic metabolic panel   Personally reviewed CT images with the patient, all questions answered.  She is totally asymptomatic at this time, I think we can just recheck creatinine.  Sugar was a bit elevated but she was not fasting at that blood draw.  Follow-up plan: Return for recheck as needed / when due for physical  .                 ############################################ ############################################ ############################################ ############################################    Outpatient Encounter Medications as of 10/17/2017  Medication Sig  . Biotin 1000 MCG tablet Take 1,000 mcg by mouth 3 (three) times daily.  . cholecalciferol (VITAMIN D) 1000 units tablet Take 1,000 Units by mouth daily.  . ondansetron (ZOFRAN ODT) 4 MG disintegrating tablet Take one tab by mouth Q6hr prn nausea.  Dissolve under tongue.  . traZODone (DESYREL) 50 MG tablet Take 0.5-3 tablets (25-150 mg total) by mouth at bedtime as needed for sleep. Start at lowest dose, increase every 2-3 nights until effective sleep achieved, use lowest effective dose.   No facility-administered encounter medications on file as of 10/17/2017.    Allergies  Allergen Reactions  . Codeine Nausea And Vomiting      Review of Systems:  Constitutional: No fever/chills  Cardiac: No  chest pain, No  pressure, No palpitations  Respiratory:  No  shortness of breath. No  Cough  Gastrointestinal: No  abdominal pain, no change on bowel habits  Musculoskeletal: No new myalgia/arthralgia  Exam:  BP 103/77 (BP Location: Left Arm, Patient Position: Sitting, Cuff Size: Normal)   Pulse 70   Temp 98.4 F (36.9 C) (Oral)   Wt 145 lb 6.4 oz (66 kg)   BMI 22.77 kg/m   Constitutional: VS see above. General Appearance: alert, well-developed, well-nourished, NAD  Neck: No masses, trachea midline.   Respiratory: Normal respiratory effort.   Musculoskeletal: Gait normal. Symmetric and independent movement of all extremities  Psychiatric: Normal judgment/insight. Normal mood and affect. Oriented x3.   Visit summary with medication list and pertinent instructions was printed for patient to review,  advised to alert [place] if any changes needed. All questions at time of visit were answered - patient  instructed to contact office with any additional concerns. ER/RTC precautions were reviewed with the patient and understanding verbalized.   Follow-up plan: Return for recheck as needed / when due for physical .  Note: Total time spent 15 minutes, greater than 50% of the visit was spent face-to-face counseling and coordinating care for the following: The encounter diagnosis was Left nephrolithiasis..  Please note: voice recognition software was used to produce this document, and typos may escape review. Please contact Dr. Sheppard Coil for any needed clarifications.

## 2017-10-18 LAB — BASIC METABOLIC PANEL
BUN: 16 mg/dL (ref 7–25)
CO2: 29 mmol/L (ref 20–32)
CREATININE: 0.79 mg/dL (ref 0.50–0.99)
Calcium: 9.7 mg/dL (ref 8.6–10.4)
Chloride: 103 mmol/L (ref 98–110)
Glucose, Bld: 100 mg/dL — ABNORMAL HIGH (ref 65–99)
Potassium: 4 mmol/L (ref 3.5–5.3)
Sodium: 139 mmol/L (ref 135–146)

## 2017-10-22 ENCOUNTER — Telehealth: Payer: Self-pay

## 2017-10-22 NOTE — Telephone Encounter (Signed)
Couldn't say without seeing it. Needs appt to evaluate before I could really answer her question.

## 2017-10-22 NOTE — Telephone Encounter (Signed)
Pt called - has a "black spot" on her back that has her concerned. She has an appt w/ Dermatologist in December. However, pt wants to know should she make an appt to see you for evaluation or if you recommend her to get a sooner appt w/ Dermatologist. Pls advise, thanks.

## 2017-10-22 NOTE — Telephone Encounter (Signed)
Spoke with patient and advised of MD instructions. Sent to front to schedule an appointment. KG LPN

## 2017-10-24 ENCOUNTER — Other Ambulatory Visit: Payer: Self-pay | Admitting: Gastroenterology

## 2017-10-24 ENCOUNTER — Ambulatory Visit (AMBULATORY_SURGERY_CENTER): Payer: 59 | Admitting: Gastroenterology

## 2017-10-24 ENCOUNTER — Encounter: Payer: Self-pay | Admitting: Gastroenterology

## 2017-10-24 VITALS — BP 127/70 | HR 65 | Temp 98.4°F | Resp 14 | Ht 66.0 in | Wt 147.0 lb

## 2017-10-24 DIAGNOSIS — Z1211 Encounter for screening for malignant neoplasm of colon: Secondary | ICD-10-CM | POA: Diagnosis present

## 2017-10-24 DIAGNOSIS — D122 Benign neoplasm of ascending colon: Secondary | ICD-10-CM

## 2017-10-24 DIAGNOSIS — K6389 Other specified diseases of intestine: Secondary | ICD-10-CM

## 2017-10-24 MED ORDER — SODIUM CHLORIDE 0.9 % IV SOLN: 500.0000 mL | Freq: Once | INTRAVENOUS | Status: DC

## 2017-10-24 NOTE — Progress Notes (Signed)
Called to room to assist during endoscopic procedure.  Patient ID and intended procedure confirmed with present staff. Received instructions for my participation in the procedure from the performing physician.  

## 2017-10-24 NOTE — Progress Notes (Signed)
A/ox3 pleased with MAC, report to RN 

## 2017-10-24 NOTE — Patient Instructions (Signed)
Please read handouts on polyps, diverticulosis, and hemorrhoids. Continue present medications.    YOU HAD AN ENDOSCOPIC PROCEDURE TODAY AT [facility]:   Refer to the procedure report that was given to you for any specific questions about what was found during the examination.  If the procedure report does not answer your questions, please call your gastroenterologist to clarify.  If you requested that your care partner not be given the details of your procedure findings, then the procedure report has been included in a sealed envelope for you to review at your convenience later.  YOU SHOULD EXPECT: Some feelings of bloating in the abdomen. Passage of more gas than usual.  Walking can help get rid of the air that was put into your GI tract during the procedure and reduce the bloating. If you had a lower endoscopy (such as a colonoscopy or flexible sigmoidoscopy) you may notice spotting of blood in your stool or on the toilet paper. If you underwent a bowel prep for your procedure, you may not have a normal bowel movement for a few days.  Please Note:  You might notice some irritation and congestion in your nose or some drainage.  This is from the oxygen used during your procedure.  There is no need for concern and it should clear up in a day or so.  SYMPTOMS TO REPORT IMMEDIATELY:   Following lower endoscopy (colonoscopy or flexible sigmoidoscopy):  Excessive amounts of blood in the stool  Significant tenderness or worsening of abdominal pains  Swelling of the abdomen that is new, acute  Fever of 100F or higher    For urgent or emergent issues, a gastroenterologist can be reached at any hour by calling [phone].   DIET:  We do recommend a small meal at first, but then you may proceed to your regular diet.  Drink plenty of fluids but you should avoid alcoholic beverages for 24 hours.  ACTIVITY:  You should plan to take it easy for the rest of today and you should NOT  DRIVE or use heavy machinery until tomorrow (because of the sedation medicines used during the test).    FOLLOW UP: Our staff will call the number listed on your records the next business day following your procedure to check on you and address any questions or concerns that you may have regarding the information given to you following your procedure. If we do not reach you, we will leave a message.  However, if you are feeling well and you are not experiencing any problems, there is no need to return our call.  We will assume that you have returned to your regular daily activities without incident.  If any biopsies were taken you will be contacted by phone or by letter within the next 1-3 weeks.  Please call us at [phone] if you have not heard about the biopsies in 3 weeks.    SIGNATURES/CONFIDENTIALITY: You and/or your care partner have signed paperwork which will be entered into your electronic medical record.  These signatures attest to the fact that that the information above on your After Visit Summary has been reviewed and is understood.  Full responsibility of the confidentiality of this discharge information lies with you and/or your care-partner.

## 2017-10-24 NOTE — Op Note (Signed)
[place] Patient Name: Teresa Morton Procedure Date: 10/24/2017 3:39 PM MRN: [id number] Endoscopist: Justice Britain , MD Age: 62 Referring MD:  Date of Birth: 13-Jan-1956 Gender: Female Account #: [id number] Procedure:                Colonoscopy Indications:              Screening for malignant neoplasm in the colon Medicines:                Monitored Anesthesia Care Procedure:                Pre-Anesthesia Assessment:                           - Prior to the procedure, a History and Physical                            was performed, and patient medications and                            allergies were reviewed. The patient's tolerance of                            previous anesthesia was also reviewed. The risks                            and benefits of the procedure and the sedation                            options and risks were discussed with the patient.                            All questions were answered, and informed consent                            was obtained. Prior Anticoagulants: The patient has                            taken no previous anticoagulant or antiplatelet                            agents. ASA Grade Assessment: I - A normal, healthy                            patient. After reviewing the risks and benefits,                            the patient was deemed in satisfactory condition to                            undergo the procedure.                           After obtaining informed consent, the colonoscope  was passed under direct vision. Throughout the                            procedure, the patient's blood pressure, pulse, and                            oxygen saturations were monitored continuously. The                            Pediatric colonoscope was introduced through the                            anus and advanced to the 5 cm into the ileum. The                            colonoscopy was  performed without difficulty. The                            patient tolerated the procedure well. The quality                            of the bowel preparation was evaluated using the                            BBPS [facility] Bowel Preparation Scale) with scores                            of: Right Colon = 3, Transverse Colon = 3 and Left                            Colon = 3 (entire mucosa seen well with no residual                            staining, small fragments of stool or opaque                            liquid). The total BBPS score equals 9. Scope In: 3:45:31 PM Scope Out: 4:00:24 PM Scope Withdrawal Time: 0 hours 11 minutes 32 seconds  Total Procedure Duration: 0 hours 14 minutes 53 seconds  Findings:                 The digital rectal exam findings include                            non-thrombosed external hemorrhoids. Pertinent                            negatives include no palpable rectal lesions.                           The terminal ileum and ileocecal valve appeared  normal.                           A few small-mouthed diverticula were found in the                            sigmoid colon.                           A 1 mm polyp was found in the ascending colon. The                            polyp was sessile. The polyp was removed with a                            jumbo cold forceps. Resection and retrieval were                            complete.                           Normal mucosa was found in the entire colon                            otherwise.                           Non-bleeding non-thrombosed external and internal                            hemorrhoids were found during retroflexion, during                            perianal exam and during digital exam. The                            hemorrhoids were Grade II (internal hemorrhoids                            that prolapse but reduce spontaneously). Complications:             No immediate complications. Estimated Blood Loss:     Estimated blood loss: none. Impression:               - Non-thrombosed external hemorrhoids found on                            digital rectal exam.                           - The examined portion of the ileum was normal.                           - Diverticulosis in the sigmoid colon.                           - One 1 mm polyp  in the ascending colon, removed                            with a jumbo cold forceps. Resected and retrieved.                           - Normal mucosa in the entire examined colon                            otherwise.                           - Non-bleeding non-thrombosed external and internal                            hemorrhoids. Recommendation:           - The patient will be observed post-procedure,                            until all discharge criteria are met.                           - Discharge patient to home.                           - Patient has a contact number available for                            emergencies. The signs and symptoms of potential                            delayed complications were discussed with the                            patient. Return to normal activities tomorrow.                            Written discharge instructions were provided to the                            patient.                           - Resume previous diet.                           - Continue present medications.                           - Await pathology results.                           - Repeat colonoscopy in 5-10 years for surveillance                            based on pathology results if adenomatous tissue is  found.                           - The findings and recommendations were discussed                            with the patient.                           - The findings and recommendations were discussed                            with the  designated responsible adult. Justice Britain, MD 10/24/2017 4:05:20 PM

## 2017-10-27 ENCOUNTER — Telehealth: Payer: Self-pay | Admitting: *Deleted

## 2017-10-27 NOTE — Telephone Encounter (Signed)
No answer for post procedure call back. Left message and will attempt to call back later this afternoon. SM 

## 2017-10-27 NOTE — Telephone Encounter (Signed)
  Follow up Call-  Call back number 10/24/2017  Post procedure Call Back phone  # [phone]  Permission to leave phone message Yes     Patient questions:  Do you have a fever, pain , or abdominal swelling? No. Pain Score  0 *  Have you tolerated food without any problems? Yes.    Have you been able to return to your normal activities? Yes.    Do you have any questions about your discharge instructions: Diet   No. Medications  No. Follow up visit  No.  Do you have questions or concerns about your Care? No.  Actions: * If pain score is 4 or above: No action needed, pain <4.

## 2017-10-31 ENCOUNTER — Encounter: Payer: Self-pay | Admitting: Gastroenterology

## 2017-11-03 ENCOUNTER — Encounter: Payer: Self-pay | Admitting: Osteopathic Medicine

## 2017-11-25 ENCOUNTER — Ambulatory Visit: Payer: 59

## 2018-05-18 ENCOUNTER — Encounter: Payer: Self-pay | Admitting: Osteopathic Medicine

## 2018-05-18 ENCOUNTER — Ambulatory Visit (INDEPENDENT_AMBULATORY_CARE_PROVIDER_SITE_OTHER): Payer: 59 | Admitting: Osteopathic Medicine

## 2018-05-18 ENCOUNTER — Other Ambulatory Visit: Payer: Self-pay

## 2018-05-18 VITALS — BP 137/77 | HR 122 | Temp 100.4°F | Wt 155.0 lb

## 2018-05-18 DIAGNOSIS — B349 Viral infection, unspecified: Secondary | ICD-10-CM

## 2018-05-18 NOTE — Patient Instructions (Addendum)
Please follow CDC instructions on staying home, self-quarantine: [email or web address]    Medications & Home Remedies for Upper Respiratory Illness   Note: the following list assumes no pregnancy, normal liver & kidney function and no other drug interactions. Dr. Sheppard Coil has highlighted medications which are safe for you to use, but these may not be appropriate for everyone. Always ask a pharmacist or qualified medical provider if you have any questions!    Aches/Pains, Fever, Headache OTC Acetaminophen (Tylenol) 500 mg tablets - take max 2 tablets (1000 mg) every 6 hours (4 times per day)    Sinus Congestion OTC Nasal Saline if desired to rinse OTC Oxymetolazone (Afrin, others) sparing use due to rebound congestion, NEVER use in kids OTC Phenylephrine (Sudafed) 10 mg tablets every 4 hours (or the 12-hour formulation) OTC Diphenhydramine (Benadryl) 25 mg tablets - take max 2 tablets every 4 hours   Cough & Sore Throat OTC Dextromethorphan (Robitussin, others) - cough suppressant OTC Guaifenesin (Robitussin, Mucinex, others) - expectorant (helps cough up mucus) (Dextromethorphan and Guaifenesin also come in a combination tablet/syrup) OTC Lozenges w/ Benzocaine + Menthol (Cepacol) Honey - as much as you want! Teas which "coat the throat" - look for ingredients Elm Bark, Licorice Root, Marshmallow Root   Other OTC Zinc Lozenges within 24 hours of symptoms onset - mixed evidence this shortens the duration of the common cold Don't waste your money on Vitamin C or Echinacea in acute illness - it's already too late!        Ready to Activate Your MyChart Account? Your Activation Code is:  [email or web address] Expires: 07/02/2018  9:33 AM   Go to  [email or web address]  or download the MyChart app.   Go to the app store, search "MyChart", open the app, select [place], and activate your account.   Need  technical help? Call [phone]-CHART. For Medical questions please contact your provider.

## 2018-05-18 NOTE — Progress Notes (Signed)
Virtual Visit  via Video or Phone Note  I connected with      Teresa Morton on 05/18/18 at 9:10 by a telemedicine application and verified that I am speaking with the correct person using two identifiers.   I discussed the limitations of evaluation and management by telemedicine and the availability of in person appointments. The patient expressed understanding and agreed to proceed.  History of Present Illness: Teresa Morton is a 63 y.o. female who would like to discuss cough, fever   . Location/Quality:chills and shakes early AM. Went away after a few hours. Coughing. Body aches.  . Duration: <24 hours, started around 01:00 today . Context: no known coronavirus contacts.  . Assoc signs/symptoms: heartburn, headache. No dizziness or weakness.   Hasn't had anything to eat/drink today.       Observations/Objective: BP 137/77 (Patient Position: Sitting, Cuff Size: Normal)   Pulse (!) 122   Temp (!) 100.4 F (38 C) (Oral)   Wt 155 lb (70.3 kg)   BMI 25.02 kg/m  BP Readings from Last 3 Encounters:  05/18/18 137/77  10/24/17 127/70  10/17/17 103/77   Exam: Normal Speech.   Lab and Radiology Results No results found for this or any previous visit (from the past 72 hour(s)). No results found.     Assessment and Plan: 63 y.o. female with The encounter diagnosis was Nonspecific syndrome suggestive of viral illness.  Advised w/ limited nature of phone visit I am unable to fully assess. I have concerns about HR and fever, possible septic picture. Symptoms of COVID-19 were reviewed w/ particular regard to SOB/respiratory illness - if worse breathing should seek emergency care ASAP. Advised self-quarantine.    PDMP not reviewed this encounter. No orders of the defined types were placed in this encounter.  No orders of the defined types were placed in this encounter.  Patient Instructions  Please follow CDC instructions on staying home, self-quarantine:  [email or web address]    Medications & Home Remedies for Upper Respiratory Illness   Note: the following list assumes no pregnancy, normal liver & kidney function and no other drug interactions. Dr. Sheppard Coil has highlighted medications which are safe for you to use, but these may not be appropriate for everyone. Always ask a pharmacist or qualified medical provider if you have any questions!    Aches/Pains, Fever, Headache OTC Acetaminophen (Tylenol) 500 mg tablets - take max 2 tablets (1000 mg) every 6 hours (4 times per day)    Sinus Congestion OTC Nasal Saline if desired to rinse OTC Oxymetolazone (Afrin, others) sparing use due to rebound congestion, NEVER use in kids OTC Phenylephrine (Sudafed) 10 mg tablets every 4 hours (or the 12-hour formulation) OTC Diphenhydramine (Benadryl) 25 mg tablets - take max 2 tablets every 4 hours   Cough & Sore Throat OTC Dextromethorphan (Robitussin, others) - cough suppressant OTC Guaifenesin (Robitussin, Mucinex, others) - expectorant (helps cough up mucus) (Dextromethorphan and Guaifenesin also come in a combination tablet/syrup) OTC Lozenges w/ Benzocaine + Menthol (Cepacol) Honey - as much as you want! Teas which "coat the throat" - look for ingredients Elm Bark, Licorice Root, Marshmallow Root   Other OTC Zinc Lozenges within 24 hours of symptoms onset - mixed evidence this shortens the duration of the common cold Don't waste your money on Vitamin C or Echinacea in acute illness - it's already too late!        Ready to Activate Your MyChart Account? Your Activation Code is:  [email or web address] Expires: 07/02/2018  9:33 AM   Go to  [email or web address]  or download the MyChart app.   Go to the app store, search "MyChart", open the app, select [place], and activate your account.   Need technical help? Call [phone]-CHART. For Medical questions please contact  your provider.        Instructions sent via MyChart. If MyChart not available, pt was given option for info via personal e-mail w/ no guarantee of protected health info over unsecured e-mail communication, and MyChart sign-up instructions were included.   Follow Up Instructions: Return if symptoms worsen or fail to improve, to ER .    I discussed the assessment and treatment plan with the patient. The patient was provided an opportunity to ask questions and all were answered. The patient agreed with the plan and demonstrated an understanding of the instructions.   The patient was advised to call back or seek an in-person evaluation if the symptoms worsen or if the condition fails to improve as anticipated.  I provided 21 minutes of non-face-to-face time during this encounter.                      Historical information moved to improve visibility of documentation.  Past Medical History:  Diagnosis Date  . GERD (gastroesophageal reflux disease)   . Melanoma in situ of trunk [facility])    Past Surgical History:  Procedure Laterality Date  . CHOLECYSTECTOMY, LAPAROSCOPIC  2005  . SALPINGOOPHORECTOMY  2007   Social History   Tobacco Use  . Smoking status: Former Smoker    Last attempt to quit: 1984    Years since quitting: 36.2  . Smokeless tobacco: Never Used  Substance Use Topics  . Alcohol use: Never    Frequency: Never   family history includes Alcoholism in her father; Liver cancer in her mother.  Medications: Current Outpatient Medications  Medication Sig Dispense Refill  . Biotin 1000 MCG tablet Take 1,000 mcg by mouth 3 (three) times daily.    . cholecalciferol (VITAMIN D) 1000 units tablet Take 1,000 Units by mouth daily.    . ondansetron (ZOFRAN ODT) 4 MG disintegrating tablet Take one tab by mouth Q6hr prn nausea.  Dissolve under tongue. (Patient not taking: Reported on 05/18/2018) 12 tablet 0  . traZODone (DESYREL) 50 MG tablet Take 0.5-3 tablets  (25-150 mg total) by mouth at bedtime as needed for sleep. Start at lowest dose, increase every 2-3 nights until effective sleep achieved, use lowest effective dose. (Patient not taking: Reported on 05/18/2018) 90 tablet 1   No current facility-administered medications for this visit.    Allergies  Allergen Reactions  . Codeine Nausea And Vomiting    Vomiting     PDMP not reviewed this encounter. No orders of the defined types were placed in this encounter.  No orders of the defined types were placed in this encounter.

## 2018-09-17 ENCOUNTER — Other Ambulatory Visit: Payer: Self-pay | Admitting: Advanced Practice Midwife

## 2018-09-17 DIAGNOSIS — Z1231 Encounter for screening mammogram for malignant neoplasm of breast: Secondary | ICD-10-CM

## 2018-09-22 ENCOUNTER — Telehealth: Payer: Self-pay

## 2018-09-22 NOTE — Telephone Encounter (Signed)
FYI - Pt called stating she has been having severe diarrhea for a couple of weeks. As per pt, usually happens right after lunch time or evening. Pt is having regular BM in the morning with no issue. Informed pt that appt is required for evaluation. Pt agreed w/ recommendation. Pt transferred to [facility] to make virtual appt w/provider.

## 2018-09-23 ENCOUNTER — Encounter: Payer: Self-pay | Admitting: Osteopathic Medicine

## 2018-09-23 ENCOUNTER — Ambulatory Visit (INDEPENDENT_AMBULATORY_CARE_PROVIDER_SITE_OTHER): Payer: 59 | Admitting: Osteopathic Medicine

## 2018-09-23 DIAGNOSIS — R197 Diarrhea, unspecified: Secondary | ICD-10-CM | POA: Diagnosis not present

## 2018-09-23 MED ORDER — DICYCLOMINE HCL 20 MG PO TABS: 20.0000 mg | ORAL_TABLET | Freq: Three times a day (TID) | ORAL | 0 refills | Status: DC | PRN

## 2018-09-23 MED ORDER — DIPHENOXYLATE-ATROPINE 2.5-0.025 MG PO TABS: 1.0000 | ORAL_TABLET | Freq: Four times a day (QID) | ORAL | 0 refills | Status: DC | PRN

## 2018-09-23 NOTE — Patient Instructions (Addendum)
PLAN:  We will get blood work and stool studies done ASAP  Please let me know if pain or fever develops, may need CT scan to rule out infection though I do not have a strong suspicion for that at this time.  Medications sent to pharmacy: Lomotil to take at lunchtime, hopefully will prevent diarrhea but can use additional doses if needed.  Can also take with dicyclomine/Bentyl if abdominal spasms.

## 2018-09-23 NOTE — Progress Notes (Signed)
Called pt, no answer. Phone keeps ringing, unable to leave a vm msg. Second call made at 1100 am, no answer. Left a vm msg.

## 2018-09-23 NOTE — Progress Notes (Signed)
Virtual Visit via Video (App used: doximity) Note  I connected with      Teresa Morton on 09/23/18 at 11:41 AM by a telemedicine application and verified that I am speaking with the correct person using two identifiers.  Patient is at home I am in office    I discussed the limitations of evaluation and management by telemedicine and the availability of in person appointments. The patient expressed understanding and agreed to proceed.  History of Present Illness: Teresa Morton is a 63 y.o. female who would like to discuss diarrhea     . Quality: loose stool, no watery stool or bloody stool, multiple BM (2-3 per day)  . Severity: about the same for a few weeks  . Duration: few weeks total . Timing: afternoon/evening, typically has late lunch which is largest meal of the day. Consistently happening after lunch time.   . Modifying factors: no meds tried except pepto bismol which didn't seem to help, no dietary changes  . Assoc signs/symptoms:     Observations/Objective: There were no vitals taken for this visit. BP Readings from Last 3 Encounters:  05/18/18 137/77  10/24/17 127/70  10/17/17 103/77   Exam: Normal Speech.  NAD  Lab and Radiology Results No results found for this or any previous visit (from the past 72 hour(s)). No results found.     Assessment and Plan: 63 y.o. female with The encounter diagnosis was Diarrhea, unspecified type.  Given duration and timing, seems less likely infectious though we will go ahead and check stool studies.  Labs as well to rule out thyroid disorder, pancreatitis.  No significant pain to make me concerned for colitis but would certainly consider CT scan if not responding to medications and all labs are normal/if pain  PDMP not reviewed this encounter. Orders Placed This Encounter  Procedures  . Stool Culture  . Ova and parasite examination  . C. difficile GDH and Toxin A/B  . Stool, WBC/Lactoferrin  . CBC with  Differential/Platelet  . COMPLETE METABOLIC PANEL WITH GFR  . Lipase  . TSH   Meds ordered this encounter  Medications  . diphenoxylate-atropine (LOMOTIL) 2.5-0.025 MG tablet    Sig: Take 1-2 tablets by mouth 4 (four) times daily as needed for diarrhea or loose stools (take prior to lunch / as needed).    Dispense:  30 tablet    Refill:  0  . dicyclomine (BENTYL) 20 MG tablet    Sig: Take 1 tablet (20 mg total) by mouth 3 (three) times daily as needed for spasms (abdominal pain).    Dispense:  30 tablet    Refill:  0   Patient Instructions  PLAN:  We will get blood work and stool studies done ASAP  Please let me know if pain or fever develops, may need CT scan to rule out infection though I do not have a strong suspicion for that at this time.  Medications sent to pharmacy: Lomotil to take at lunchtime, hopefully will prevent diarrhea but can use additional doses if needed.  Can also take with dicyclomine/Bentyl if abdominal spasms.   Instructions sent via MyChart. If MyChart not available, pt was given option for info via personal e-mail w/ no guarantee of protected health info over unsecured e-mail communication, and MyChart sign-up instructions were included.   Follow Up Instructions: Return for RECHECK DIARRHEA PENDING RESULTS / IF WORSE OR CHANGE.    I discussed the assessment and treatment plan with the patient. The patient was  provided an opportunity to ask questions and all were answered. The patient agreed with the plan and demonstrated an understanding of the instructions.   The patient was advised to call back or seek an in-person evaluation if any new concerns, if symptoms worsen or if the condition fails to improve as anticipated.  25 minutes of non-face-to-face time was provided during this encounter.                      Historical information moved to improve visibility of documentation.  Past Medical History:  Diagnosis Date  . GERD  (gastroesophageal reflux disease)   . 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 CHOLECYSTECTOMY, LAPAROSCOPIC  2005  . SALPINGOOPHORECTOMY  2007   Social History   Tobacco Use  . Smoking status: Former Smoker    Quit date: 1984    Years since quitting: 36.6  . Smokeless tobacco: Never Used  Substance Use Topics  . Alcohol use: Never    Frequency: Never   family history includes Alcoholism in her father; Liver cancer in her mother.  Medications: Current Outpatient Medications  Medication Sig Dispense Refill  . Biotin 1000 MCG tablet Take 1,000 mcg by mouth 3 (three) times daily.    . cholecalciferol (VITAMIN D) 1000 units tablet Take 1,000 Units by mouth daily.    Marland Kitchen dicyclomine (BENTYL) 20 MG tablet Take 1 tablet (20 mg total) by mouth 3 (three) times daily as needed for spasms (abdominal pain). 30 tablet 0  . diphenoxylate-atropine (LOMOTIL) 2.5-0.025 MG tablet Take 1-2 tablets by mouth 4 (four) times daily as needed for diarrhea or loose stools (take prior to lunch / as needed). 30 tablet 0  . ondansetron (ZOFRAN ODT) 4 MG disintegrating tablet Take one tab by mouth Q6hr prn nausea.  Dissolve under tongue. (Patient not taking: Reported on 09/23/2018) 12 tablet 0  . traZODone (DESYREL) 50 MG tablet Take 0.5-3 tablets (25-150 mg total) by mouth at bedtime as needed for sleep. Start at lowest dose, increase every 2-3 nights until effective sleep achieved, use lowest effective dose. (Patient not taking: Reported on 09/23/2018) 90 tablet 1   No current facility-administered medications for this visit.    Allergies  Allergen Reactions  . Codeine Nausea And Vomiting    Vomiting  Vomiting     PDMP not reviewed this encounter. Orders Placed This Encounter  Procedures  . Stool Culture  . Ova and parasite examination  . C. difficile GDH and Toxin A/B  . Stool, WBC/Lactoferrin  . CBC with Differential/Platelet  . COMPLETE METABOLIC PANEL  WITH GFR  . Lipase  . TSH   Meds ordered this encounter  Medications  . diphenoxylate-atropine (LOMOTIL) 2.5-0.025 MG tablet    Sig: Take 1-2 tablets by mouth 4 (four) times daily as needed for diarrhea or loose stools (take prior to lunch / as needed).    Dispense:  30 tablet    Refill:  0  . dicyclomine (BENTYL) 20 MG tablet    Sig: Take 1 tablet (20 mg total) by mouth 3 (three) times daily as needed for spasms (abdominal pain).    Dispense:  30 tablet    Refill:  0

## 2018-09-24 LAB — CBC WITH DIFFERENTIAL/PLATELET
Absolute Monocytes: 365 cells/uL (ref 200–950)
Basophils Absolute: 22 cells/uL (ref 0–200)
Basophils Relative: 0.5 %
Eosinophils Absolute: 92 cells/uL (ref 15–500)
Eosinophils Relative: 2.1 %
HCT: 38.5 % (ref 35.0–45.0)
Hemoglobin: 13.3 g/dL (ref 11.7–15.5)
Lymphs Abs: 1404 cells/uL (ref 850–3900)
MCH: 31 pg (ref 27.0–33.0)
MCHC: 34.5 g/dL (ref 32.0–36.0)
MCV: 89.7 fL (ref 80.0–100.0)
MPV: 10.6 fL (ref 7.5–12.5)
Monocytes Relative: 8.3 %
Neutro Abs: 2517 cells/uL (ref 1500–7800)
Neutrophils Relative %: 57.2 %
Platelets: 249 10*3/uL (ref 140–400)
RBC: 4.29 10*6/uL (ref 3.80–5.10)
RDW: 13.3 % (ref 11.0–15.0)
Total Lymphocyte: 31.9 %
WBC: 4.4 10*3/uL (ref 3.8–10.8)

## 2018-09-24 LAB — COMPLETE METABOLIC PANEL WITH GFR
AG Ratio: 1.7 (calc) (ref 1.0–2.5)
ALT: 20 U/L (ref 6–29)
AST: 15 U/L (ref 10–35)
Albumin: 4.4 g/dL (ref 3.6–5.1)
Alkaline phosphatase (APISO): 60 U/L (ref 37–153)
BUN/Creatinine Ratio: 13 (calc) (ref 6–22)
BUN: 14 mg/dL (ref 7–25)
CO2: 27 mmol/L (ref 20–32)
Calcium: 9.6 mg/dL (ref 8.6–10.4)
Chloride: 106 mmol/L (ref 98–110)
Creat: 1.04 mg/dL — ABNORMAL HIGH (ref 0.50–0.99)
GFR, Est African American: 67 mL/min/{1.73_m2} (ref >=60)
GFR, Est Non African American: 58 mL/min/{1.73_m2} — ABNORMAL LOW (ref >=60)
Globulin: 2.6 g/dL (calc) (ref 1.9–3.7)
Glucose, Bld: 135 mg/dL — ABNORMAL HIGH (ref 65–99)
Potassium: 4 mmol/L (ref 3.5–5.3)
Sodium: 143 mmol/L (ref 135–146)
Total Bilirubin: 0.7 mg/dL (ref 0.2–1.2)
Total Protein: 7 g/dL (ref 6.1–8.1)

## 2018-09-24 LAB — LIPASE: Lipase: 38 U/L (ref 7–60)

## 2018-09-24 LAB — TSH: TSH: 0.44 mIU/L (ref 0.40–4.50)

## 2018-09-28 ENCOUNTER — Telehealth: Payer: Self-pay

## 2018-09-28 LAB — STOOL CULTURE
MICRO NUMBER:: 746015
MICRO NUMBER:: 746016
MICRO NUMBER:: 746017
SHIGA RESULT:: NOT DETECTED
SPECIMEN QUALITY:: ADEQUATE
SPECIMEN QUALITY:: ADEQUATE
SPECIMEN QUALITY:: ADEQUATE

## 2018-09-28 LAB — OVA AND PARASITE EXAMINATION
CONCENTRATE RESULT:: NONE SEEN
MICRO NUMBER:: 745236
SPECIMEN QUALITY:: ADEQUATE
TRICHROME RESULT:: NONE SEEN

## 2018-09-28 LAB — FECAL LACTOFERRIN, QUANT
Fecal Lactoferrin: NEGATIVE
MICRO NUMBER:: 745326
SPECIMEN QUALITY:: ADEQUATE

## 2018-09-28 LAB — C. DIFFICILE GDH AND TOXIN A/B
GDH ANTIGEN: NOT DETECTED
MICRO NUMBER:: 745325
SPECIMEN QUALITY:: ADEQUATE
TOXIN A AND B: NOT DETECTED

## 2018-09-28 NOTE — Telephone Encounter (Signed)
Pt left a vm msg stating she is due to travel. She is requesting to have Covid testing before her travels. Pls advise, thanks.

## 2018-09-28 NOTE — Telephone Encounter (Signed)
Routing to triage.  I am okay to have her do a self swab here in the office since she does not have symptoms.  How are we scheduling these?  Are these on the nurse schedule or double-booking on providers schedule?  Whichever is easiest, let me know, I am okay to double book on my schedule.

## 2018-09-29 NOTE — Telephone Encounter (Signed)
Due to staffing, better to have this double booked on providers schedule.   Janett Billow, please call pt and get this scheduled. Thanks!

## 2018-09-29 NOTE — Telephone Encounter (Signed)
Doesn't matter. I guess whoever is on the schedule to cover for me?

## 2018-09-29 NOTE — Telephone Encounter (Signed)
Patient wants to get tested on 08/31, Monday. Dr.Alexander is not in office that day to put patient on her schedule. Where can I schedule patient? Please advise.

## 2018-09-29 NOTE — Telephone Encounter (Signed)
Appointment has been made. No further questions at this time.  

## 2018-10-17 ENCOUNTER — Emergency Department (INDEPENDENT_AMBULATORY_CARE_PROVIDER_SITE_OTHER)
Admission: EM | Admit: 2018-10-17 | Discharge: 2018-10-17 | Disposition: A | Discharge status: Home / Self Care | Payer: 59 | Source: Home / Self Care | Attending: Family Medicine | Admitting: Family Medicine

## 2018-10-17 ENCOUNTER — Other Ambulatory Visit: Payer: Self-pay

## 2018-10-17 DIAGNOSIS — R300 Dysuria: Secondary | ICD-10-CM

## 2018-10-17 DIAGNOSIS — N3090 Cystitis, unspecified without hematuria: Secondary | ICD-10-CM

## 2018-10-17 LAB — POCT URINALYSIS DIP (MANUAL ENTRY)
Glucose, UA: NEGATIVE mg/dL
Ketones, POC UA: NEGATIVE mg/dL
Nitrite, UA: POSITIVE — AB
Protein Ur, POC: >=300 mg/dL — AB
Spec Grav, UA: >=1.03 — AB (ref 1.010–1.025)
Urobilinogen, UA: 0.2 E.U./dL
pH, UA: 5.5 (ref 5.0–8.0)

## 2018-10-17 MED ORDER — CEPHALEXIN 500 MG PO CAPS: 500.0000 mg | ORAL_CAPSULE | Freq: Two times a day (BID) | ORAL | 0 refills | Status: DC

## 2018-10-17 NOTE — ED Provider Notes (Signed)
[facility]    CSN: [id number] Arrival date & time: 10/17/18  1415      History   Chief Complaint Chief Complaint  Patient presents with  . Dysuria    HPI Teresa Morton is a 63 y.o. female.   Patient complains of 1.5 day history of dysuria and urgency.  She denies abdominal/pelvic pain, back pain, and fever.  The history is provided by the patient.  Dysuria Pain quality:  Burning Pain severity:  Mild Onset quality:  Sudden Duration:  2 days Timing:  Constant Progression:  Worsening Chronicity:  New Recent urinary tract infections: no   Relieved by:  None tried Worsened by:  Nothing Ineffective treatments:  None tried Urinary symptoms: discolored urine, frequent urination and hesitancy   Urinary symptoms: no foul-smelling urine, no hematuria and no bladder incontinence   Associated symptoms: no abdominal pain, no fever, no flank pain, no genital lesions, no nausea, no vaginal discharge and no vomiting     Past Medical History:  Diagnosis Date  . GERD (gastroesophageal reflux disease)   . 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 Left nephrolithiasis 10/17/2017  . Postmenopausal 09/25/2017  . History of kidney stones 09/25/2017  . Melanoma in situ of trunk ([place]) 09/25/2017  . Hemorrhoids 09/25/2017    Past Surgical History:  Procedure Laterality Date  . CHOLECYSTECTOMY, LAPAROSCOPIC  2005  . SALPINGOOPHORECTOMY  2007    OB History    Gravida  3   Para  3   Term  3   Preterm      AB      Living        SAB      TAB      Ectopic      Multiple      Live Births               Home Medications    Prior to Admission medications   Medication Sig Start Date End Date Taking? Authorizing Provider  Biotin 1000 MCG tablet Take 1,000 mcg by mouth 3 (three) times daily.    [provider]  cephALEXin (KEFLEX) 500 MG capsule Take 1 capsule (500 mg total) by mouth 2  (two) times daily. 10/17/18   Kandra Nicolas, MD  cholecalciferol (VITAMIN D) 1000 units tablet Take 1,000 Units by mouth daily.    [provider]  dicyclomine (BENTYL) 20 MG tablet Take 1 tablet (20 mg total) by mouth 3 (three) times daily as needed for spasms (abdominal pain). 09/23/18   Emeterio Reeve, DO  diphenoxylate-atropine (LOMOTIL) 2.5-0.025 MG tablet Take 1-2 tablets by mouth 4 (four) times daily as needed for diarrhea or loose stools (take prior to lunch / as needed). 09/23/18   Emeterio Reeve, DO  ondansetron (ZOFRAN ODT) 4 MG disintegrating tablet Take one tab by mouth Q6hr prn nausea.  Dissolve under tongue. Patient not taking: Reported on 09/23/2018 10/09/17   Kandra Nicolas, MD  traZODone (DESYREL) 50 MG tablet Take 0.5-3 tablets (25-150 mg total) by mouth at bedtime as needed for sleep. Start at lowest dose, increase every 2-3 nights until effective sleep achieved, use lowest effective dose. Patient not taking: Reported on 09/23/2018 10/13/17   Emeterio Reeve, DO    Family History Family History  Problem Relation Age of Onset  . Liver cancer Mother   . Alcoholism Father   . Colon cancer Neg Hx   .  Esophageal cancer Neg Hx   . Rectal cancer Neg Hx   . Stomach cancer Neg Hx     Social History Social History   Tobacco Use  . Smoking status: Former Smoker    Quit date: 1984    Years since quitting: 36.6  . Smokeless tobacco: Never Used  Substance Use Topics  . Alcohol use: Never    Frequency: Never  . Drug use: Never     Allergies   Codeine   Review of Systems Review of Systems  Constitutional: Negative for chills, diaphoresis, fatigue and fever.  Gastrointestinal: Negative for abdominal pain, nausea and vomiting.  Genitourinary: Positive for dysuria, frequency and urgency. Negative for flank pain and vaginal discharge.  All other systems reviewed and are negative.    Physical Exam Triage Vital Signs ED Triage Vitals [10/17/18 1433]   Enc Vitals Group     BP 114/76     Pulse Rate 91     Resp 18     Temp 98.3 F (36.8 C)     Temp Source Oral     SpO2 95 %     Weight 143 lb (64.9 kg)     Height 5\' 6"  (1.676 m)     Head Circumference      Peak Flow      Pain Score 0     Pain Loc      Pain Edu?      Excl. in [place]?    No data found.  Updated Vital Signs BP 114/76 (BP Location: Right Arm)   Pulse 91   Temp 98.3 F (36.8 C) (Oral)   Resp 18   Ht 5\' 6"  (1.676 m)   Wt 64.9 kg   SpO2 95%   BMI 23.08 kg/m   Visual Acuity Right Eye Distance:   Left Eye Distance:   Bilateral Distance:    Right Eye Near:   Left Eye Near:    Bilateral Near:     Physical Exam Nursing notes and Vital Signs reviewed. Appearance:  Patient appears stated age, and in no acute distress.    Eyes:  Pupils are equal, round, and reactive to light and accomodation.  Extraocular movement is intact.  Conjunctivae are not inflamed   Pharynx:  Normal; moist mucous membranes  Neck:  Supple.  No adenopathy Lungs:  Clear to auscultation.  Breath sounds are equal.  Moving air well. Heart:  Regular rate and rhythm without murmurs, rubs, or gallops.  Abdomen:  Nontender without masses or hepatosplenomegaly.  Bowel sounds are present.  No CVA or flank tenderness.  Extremities:  No edema.  Skin:  No rash present.     UC Treatments / Results  Labs (all labs ordered are listed, but only abnormal results are displayed) Labs Reviewed  POCT URINALYSIS DIP (MANUAL ENTRY) - Abnormal; Notable for the following components:      Result Value   Clarity, UA cloudy (*)    Bilirubin, UA small (*)    Spec Grav, UA >=1.030 (*)    Blood, UA large (*)    Protein Ur, POC >=300 (*)    Nitrite, UA Positive (*)    Leukocytes, UA Trace (*)    All other components within normal limits  URINE CULTURE    EKG   Radiology No results found.  Procedures Procedures (including critical care time)  Medications Ordered in UC Medications - No data to display   Initial Impression / Assessment and Plan / UC Course  I  have reviewed the triage vital signs and the nursing notes.  Pertinent labs & imaging results that were available during my care of the patient were reviewed by me and considered in my medical decision making (see chart for details).    Begin Keflex.  Urine culture pending. Followup with Family Doctor if not improved in about 6 days.   Final Clinical Impressions(s) / UC Diagnoses   Final diagnoses:  Dysuria  Cystitis     Discharge Instructions     Increase fluid intake. May use non-prescription AZO for about two days, if desired, to decrease urinary discomfort.   If symptoms become significantly worse during the night or over the weekend, proceed to the local emergency room.     ED Prescriptions    Medication Sig Dispense Auth. Provider   cephALEXin (KEFLEX) 500 MG capsule Take 1 capsule (500 mg total) by mouth 2 (two) times daily. 14 capsule Kandra Nicolas, MD        Kandra Nicolas, MD 10/19/18 1154

## 2018-10-17 NOTE — ED Triage Notes (Signed)
Pt c/o painful urination x 1.5 days. Also dark urine. Has not tried any OTC meds.

## 2018-10-17 NOTE — Discharge Instructions (Addendum)
Increase fluid intake. May use non-prescription AZO for about two days, if desired, to decrease urinary discomfort.  If symptoms become significantly worse during the night or over the weekend, proceed to the local emergency room.  

## 2018-10-19 ENCOUNTER — Ambulatory Visit: Payer: 59 | Admitting: Physician Assistant

## 2018-10-19 LAB — URINE CULTURE
MICRO NUMBER:: 827807
SPECIMEN QUALITY:: ADEQUATE

## 2018-10-20 ENCOUNTER — Telehealth (HOSPITAL_COMMUNITY): Payer: Self-pay | Admitting: Emergency Medicine

## 2018-10-20 NOTE — Telephone Encounter (Signed)
Urine culture was positive for e coli and was given keflex at urgent care visit. Pt contacted and made aware, educated on completing antibiotic and to follow up if symptoms are persistent. Verbalized understanding.    

## 2018-10-28 ENCOUNTER — Other Ambulatory Visit: Payer: Self-pay

## 2018-10-28 ENCOUNTER — Ambulatory Visit (INDEPENDENT_AMBULATORY_CARE_PROVIDER_SITE_OTHER): Payer: 59

## 2018-10-28 DIAGNOSIS — Z1231 Encounter for screening mammogram for malignant neoplasm of breast: Secondary | ICD-10-CM

## 2018-11-20 ENCOUNTER — Telehealth: Payer: Self-pay

## 2018-11-20 NOTE — Telephone Encounter (Signed)
Pt called with concerns of blood in her urine. As per pt, she's had 3 episodes where she went to urinate the urine  was "pink". Denies fever, chills or dysuria. She mentioned that she was seen in August for a similar situation and does have history of kidney stones. Pt has been scheduled to be seen on 11/23/18 for evaluation. Pt advised to go to [facility] / ER if her symptoms change or worsen before her appt with provider. Pt was agreeable with plan.

## 2018-11-20 NOTE — Telephone Encounter (Signed)
Agree with documentation as above.   Catherine Metheney, MD  

## 2018-11-23 ENCOUNTER — Ambulatory Visit (INDEPENDENT_AMBULATORY_CARE_PROVIDER_SITE_OTHER): Payer: 59 | Admitting: Osteopathic Medicine

## 2018-11-23 ENCOUNTER — Other Ambulatory Visit: Payer: Self-pay

## 2018-11-23 ENCOUNTER — Encounter: Payer: Self-pay | Admitting: Osteopathic Medicine

## 2018-11-23 VITALS — BP 144/82 | HR 86 | Temp 98.2°F | Wt 144.3 lb

## 2018-11-23 DIAGNOSIS — N3091 Cystitis, unspecified with hematuria: Secondary | ICD-10-CM

## 2018-11-23 LAB — POCT URINALYSIS DIPSTICK
Bilirubin, UA: NEGATIVE
Glucose, UA: NEGATIVE
Ketones, UA: NEGATIVE
Nitrite, UA: NEGATIVE
Protein, UA: NEGATIVE
Spec Grav, UA: 1.025 (ref 1.010–1.025)
Urobilinogen, UA: 0.2 E.U./dL
pH, UA: 6 (ref 5.0–8.0)

## 2018-11-23 MED ORDER — CIPROFLOXACIN HCL 500 MG PO TABS: 500.0000 mg | ORAL_TABLET | Freq: Two times a day (BID) | ORAL | 0 refills | Status: DC

## 2018-11-23 NOTE — Progress Notes (Signed)
HPI: Teresa Morton is a 63 y.o. female who  has a past medical history of GERD (gastroesophageal reflux disease) and Melanoma in situ of trunk ([place]).  she presents to [facility] today, 11/23/18,  for chief complaint of: blood in urine  . Context: Patient was treated with Keflex 500 mg x 7d on 8/29 for UTI. Culture showed E. Coli. Two weeks ago, patient woke up and noticed mild lower left flank pain + temporary nausea for 10 mins. Later that same afternoon, noticed blood in urine which resolved. Blood in urine recurred on Thursday and has since resolved. She has a history of kidney stones last year that passed on their own but reports these symptoms feel different.  . Quality: Urine was initially pinkish. The last two times it was bright red.  . Severity: No pain during urination. . Duration: 2 weeks . Timing: Intermittent . Modifying factors: Some lower abdominal fullness/bloating.  . Assoc signs/symptoms: Reports some increased urinary frequency which may be due to increased hydration. Reports fatigue.  Denies n/v, dysuria, foul-smell, discoloration, flank pain, urinary hesitancy, vaginal discharge, vaginal spotting,  LE edema, facial edema, rashes, vomiting.   Past medical, surgical, social and family history reviewed and updated as necessary.   Current medication list and allergy/intolerance information reviewed:    Current Outpatient Medications  Medication Sig Dispense Refill  . Biotin 1000 MCG tablet Take 1,000 mcg by mouth 3 (three) times daily.    . cephALEXin (KEFLEX) 500 MG capsule Take 1 capsule (500 mg total) by mouth 2 (two) times daily. 14 capsule 0  . cholecalciferol (VITAMIN D) 1000 units tablet Take 1,000 Units by mouth daily.    Marland Kitchen dicyclomine (BENTYL) 20 MG tablet Take 1 tablet (20 mg total) by mouth 3 (three) times daily as needed for spasms (abdominal pain). 30 tablet 0  . diphenoxylate-atropine (LOMOTIL) 2.5-0.025 MG tablet Take  1-2 tablets by mouth 4 (four) times daily as needed for diarrhea or loose stools (take prior to lunch / as needed). 30 tablet 0  . ondansetron (ZOFRAN ODT) 4 MG disintegrating tablet Take one tab by mouth Q6hr prn nausea.  Dissolve under tongue. (Patient not taking: Reported on 09/23/2018) 12 tablet 0  . traZODone (DESYREL) 50 MG tablet Take 0.5-3 tablets (25-150 mg total) by mouth at bedtime as needed for sleep. Start at lowest dose, increase every 2-3 nights until effective sleep achieved, use lowest effective dose. (Patient not taking: Reported on 09/23/2018) 90 tablet 1   No current facility-administered medications for this visit.     Allergies  Allergen Reactions  . Codeine Nausea And Vomiting    Vomiting  Vomiting      Review of Systems:  Constitutional:  + Fatigue. Recent UTI.  No  fever, no chills.   HEENT: No  headache  Cardiac: Heartburn. No  chest pain, No  pressure, No palpitations  Respiratory:  No  shortness of breath. No  Cough  Gastrointestinal: + Lower abdominal fullness and bloating.  No recent nausea, No  vomiting, No  blood in stool, No diarrhea  Genitourinary: Increased urinary frequency. No hesitancy, dysuria, vaginal discharge.   Skin: No Rash  Exam:  BP (!) 144/82 (BP Location: Left Arm, Patient Position: Sitting, Cuff Size: Normal)   Pulse 86   Temp 98.2 F (36.8 C) (Oral)   Wt 144 lb 4.8 oz (65.5 kg)   BMI 23.29 kg/m   Constitutional: VS see above. General Appearance: alert, well-developed, well-nourished, NAD, Non toxic  appearing.   Eyes: Normal lids and conjunctive, non-icteric sclera  Neck: No masses, trachea midline.   Respiratory: Normal respiratory effort. no wheeze, no rhonchi, no rales  Cardiovascular: S1/S2 normal, no murmur, no rub/gallop auscultated. RRR. No lower extremity edema.    Gastrointestinal: Mild RLQ tenderness. no masses. Bowel sounds normal.  Skin: warm, dry, intact.   Psychiatric: Normal judgment/insight. Normal mood  and affect.   Results for orders placed or performed in visit on 11/23/18 (from the past 72 hour(s))  POCT Urinalysis Dipstick     Status: Abnormal   Collection Time: 11/23/18  1:25 PM  Result Value Ref Range   Color, UA Yellow    Clarity, UA Cloudy    Glucose, UA Negative Negative   Bilirubin, UA Negative    Ketones, UA Negative    Spec Grav, UA 1.025 1.010 - 1.025   Blood, UA Moderate    pH, UA 6.0 5.0 - 8.0   Protein, UA Negative Negative   Urobilinogen, UA 0.2 0.2 or 1.0 E.U./dL   Nitrite, UA Negative    Leukocytes, UA Small (1+) (A) Negative   Appearance     Odor      No results found.   ASSESSMENT/PLAN: The encounter diagnosis was Dysuria.   Teresa Morton is a 63 y.o. F who presents to the clinic due to hematuria. Patient was recently treated with Keflex for a UTI on 8/29. UA in office showed cloudy urine with moderate blood and leukocytes. Will order UCx and microscopic UA and start treatment with Cipro 500 mg BID for 7 days.   With symptoms recurring 2 weeks after treatment, suspect recurrent UTI. However can't rule out kidney stones given patient's history of nephrolithiasis. Would have low threshold to refer to Urology with recurrent hematuria and UTI, and symptoms not typical of UTI. ?interstitial cystitis?    Meds ordered this encounter  Medications  . ciprofloxacin (CIPRO) 500 MG tablet    Sig: Take 1 tablet (500 mg total) by mouth 2 (two) times daily.    Dispense:  14 tablet    Refill:  0    Orders Placed This Encounter  Procedures  . Urine Culture  . Urinalysis, microscopic only  . POCT Urinalysis Dipstick    There are no Patient Instructions on file for this visit.     Visit summary with medication list and pertinent instructions was printed for patient to review. All questions at time of visit were answered - patient instructed to contact office with any additional concerns or updates. ER/RTC precautions were reviewed with the patient.    Follow-up plan: Return if symptoms worsen or fail to improve.  Note: Total time spent 25 minutes, greater than 50% of the visit was spent face-to-face counseling and coordinating care for the following: The encounter diagnosis was Dysuria..  Please note: voice recognition software was used to produce this document, and typos may escape review. Please contact Dr. Sheppard Coil for any needed clarifications.

## 2018-11-25 LAB — URINALYSIS, MICROSCOPIC ONLY: Hyaline Cast: NONE SEEN /LPF

## 2018-11-25 LAB — URINE CULTURE
MICRO NUMBER:: 955406
SPECIMEN QUALITY:: ADEQUATE

## 2018-12-01 ENCOUNTER — Ambulatory Visit (INDEPENDENT_AMBULATORY_CARE_PROVIDER_SITE_OTHER): Payer: 59 | Admitting: Physician Assistant

## 2018-12-01 ENCOUNTER — Encounter: Payer: Self-pay | Admitting: Physician Assistant

## 2018-12-01 ENCOUNTER — Other Ambulatory Visit: Payer: Self-pay

## 2018-12-01 VITALS — BP 137/98 | HR 88 | Ht 66.0 in | Wt 141.0 lb

## 2018-12-01 DIAGNOSIS — R198 Other specified symptoms and signs involving the digestive system and abdomen: Secondary | ICD-10-CM | POA: Diagnosis not present

## 2018-12-01 DIAGNOSIS — R82998 Other abnormal findings in urine: Secondary | ICD-10-CM | POA: Diagnosis not present

## 2018-12-01 LAB — POCT URINALYSIS DIPSTICK
Bilirubin, UA: NEGATIVE
Glucose, UA: NEGATIVE
Ketones, UA: NEGATIVE
Nitrite, UA: NEGATIVE
Protein, UA: NEGATIVE
Spec Grav, UA: 1.025 (ref 1.010–1.025)
Urobilinogen, UA: 0.2 E.U./dL
pH, UA: 6 (ref 5.0–8.0)

## 2018-12-01 NOTE — Progress Notes (Signed)
Subjective:    Patient ID: Teresa Morton, female    DOB: [birth year], 63 y.o.   MRN: [id number]  HPI Pt is a 63 yo female with persistent dark urine who was recently treated for cystitis twice. Pt does have hx of kidney stones.  Her symptoms started a few days before 10/17/18 when she went to UC and urine culture found to have E.coli and she was treated with keflex. Her painful symptoms resolved but she was still having "blood in urine" and followed up with Dr. Loni Muse on 11/23/18. Dr. Loni Muse ordered urine work up and treated her with another round of cipro. Culture came back showing signs of contamination not infection and mircoscopic showed very few RBC, some WBC, few bacteria. Pt finished cipro. She is not having any pain with urination/flank pain/fever/chills/nausea/vomiting/diarrhea. She is having a fullness sensation of her abdomen. She is noticing what she thought to be dark blood when she urinates. It is not all the time it comes and goes. She denies any vaginal discharge/pain/bleeding.   .. Active Ambulatory Problems    Diagnosis Date Noted  . Postmenopausal 09/25/2017  . History of kidney stones 09/25/2017  . 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 Hemorrhoids 09/25/2017  . Left nephrolithiasis 10/17/2017  . Abdominal fullness 12/02/2018  . Dark urine 12/02/2018   Resolved Ambulatory Problems    Diagnosis Date Noted  . No Resolved Ambulatory Problems   Past Medical History:  Diagnosis Date  . GERD (gastroesophageal reflux disease)        Review of Systems  All other systems reviewed and are negative.      Objective:   Physical Exam Vitals signs reviewed.  Constitutional:      Appearance: Normal appearance.  HENT:     Head: Normocephalic.  Cardiovascular:     Rate and Rhythm: Normal rate and regular rhythm.  Pulmonary:     Effort: Pulmonary effort is normal.     Breath sounds: Normal breath sounds.  Abdominal:     General: Bowel sounds are normal. There is  distension.     Palpations: Abdomen is soft. There is no mass.     Tenderness: There is no abdominal tenderness. There is no right CVA tenderness, left CVA tenderness, guarding or rebound.     Hernia: No hernia is present.  Neurological:     General: No focal deficit present.     Mental Status: She is alert and oriented to person, place, and time.  Psychiatric:        Mood and Affect: Mood normal.     .. Results for orders placed or performed in visit on 12/01/18  COMPLETE METABOLIC PANEL WITH GFR  Result Value Ref Range   Glucose, Bld 104 (H) 65 - 99 mg/dL   BUN 12 7 - 25 mg/dL   Creat 0.68 0.50 - 0.99 mg/dL   GFR, Est Non African American 94 > OR = 60 mL/min/1.60m2   GFR, Est African American 109 > OR = 60 mL/min/1.42m2   BUN/Creatinine Ratio NOT APPLICABLE 6 - 22 (calc)   Sodium 139 135 - 146 mmol/L   Potassium 3.9 3.5 - 5.3 mmol/L   Chloride 102 98 - 110 mmol/L   CO2 27 20 - 32 mmol/L   Calcium 9.9 8.6 - 10.4 mg/dL   Total Protein 7.4 6.1 - 8.1 g/dL   Albumin 4.7 3.6 - 5.1 g/dL   Globulin 2.7 1.9 - 3.7 g/dL (calc)   AG Ratio 1.7 1.0 -  2.5 (calc)   Total Bilirubin 1.0 0.2 - 1.2 mg/dL   Alkaline phosphatase (APISO) 53 37 - 153 U/L   AST 21 10 - 35 U/L   ALT 23 6 - 29 U/L  CBC w/Diff/Platelet  Result Value Ref Range   WBC 4.2 3.8 - 10.8 Thousand/uL   RBC 4.56 3.80 - 5.10 Million/uL   Hemoglobin 14.2 11.7 - 15.5 g/dL   HCT 41.6 35.0 - 45.0 %   MCV 91.2 80.0 - 100.0 fL   MCH 31.1 27.0 - 33.0 pg   MCHC 34.1 32.0 - 36.0 g/dL   RDW 13.3 11.0 - 15.0 %   Platelets 261 140 - 400 Thousand/uL   MPV 10.3 7.5 - 12.5 fL   Neutro Abs 2,247 1,500 - 7,800 cells/uL   Lymphs Abs 1,336 850 - 3,900 cells/uL   Absolute Monocytes 517 200 - 950 cells/uL   Eosinophils Absolute 80 15 - 500 cells/uL   Basophils Absolute 21 0 - 200 cells/uL   Neutrophils Relative % 53.5 %   Total Lymphocyte 31.8 %   Monocytes Relative 12.3 %   Eosinophils Relative 1.9 %   Basophils Relative 0.5 %   Urinalysis, microscopic only  Result Value Ref Range   WBC, UA 0-5 0 - 5 /HPF   RBC / HPF NONE SEEN 0 - 2 /HPF   Squamous Epithelial / LPF 0-5 < OR = 5 /HPF   Bacteria, UA NONE SEEN NONE SEEN /HPF   Hyaline Cast NONE SEEN NONE SEEN /LPF  POCT urinalysis dipstick  Result Value Ref Range   Color, UA yellow    Clarity, UA clear    Glucose, UA Negative Negative   Bilirubin, UA negative    Ketones, UA negative    Spec Grav, UA 1.025 1.010 - 1.025   Blood, UA moderate    pH, UA 6.0 5.0 - 8.0   Protein, UA Negative Negative   Urobilinogen, UA 0.2 0.2 or 1.0 E.U./dL   Nitrite, UA negative    Leukocytes, UA Large (3+) (A) Negative   Appearance     Odor           Assessment & Plan:  Marland KitchenMarland KitchenClaudette was seen today for hematuria and back pain.  Diagnoses and all orders for this visit:  Dark urine -     POCT urinalysis dipstick -     Urine Culture -     COMPLETE METABOLIC PANEL WITH GFR -     CBC w/Diff/Platelet -     US PELVIS (TRANSABDOMINAL ONLY) -     US PELVIS TRANSVAGINAL NON-OB (TV ONLY) -     Urinalysis, microscopic only -     Ambulatory referral to Urology  Abdominal fullness -     COMPLETE METABOLIC PANEL WITH GFR -     CBC w/Diff/Platelet -     US PELVIS (TRANSABDOMINAL ONLY) -     US PELVIS TRANSVAGINAL NON-OB (TV ONLY) -     Urinalysis, microscopic only   Pt does have a hx of kidney stones but with signs that dark urine is not hematuria(no RBC) and the fact that she is not having a lot of pain is less likely.   UA today did show moderate blood and leuks.  Will culture and do another microscopic.   Since patient is having some abdominal fullness would like to do a pelvic ultrasound, CBC, CMP.   Looking at causes of hemoglobinuria she is not on any medications that would cause this, discussed foods  and she is not eating those foods, and has no diseases that would support this.  Will make referral to urology.   Keep close follow up and let me know if do not get  appt with urology soon.

## 2018-12-02 ENCOUNTER — Other Ambulatory Visit: Payer: Self-pay

## 2018-12-02 ENCOUNTER — Ambulatory Visit (INDEPENDENT_AMBULATORY_CARE_PROVIDER_SITE_OTHER): Payer: 59

## 2018-12-02 DIAGNOSIS — R82998 Other abnormal findings in urine: Secondary | ICD-10-CM | POA: Insufficient documentation

## 2018-12-02 DIAGNOSIS — R198 Other specified symptoms and signs involving the digestive system and abdomen: Secondary | ICD-10-CM | POA: Diagnosis not present

## 2018-12-02 LAB — CBC WITH DIFFERENTIAL/PLATELET
Absolute Monocytes: 517 cells/uL (ref 200–950)
Basophils Absolute: 21 cells/uL (ref 0–200)
Basophils Relative: 0.5 %
Eosinophils Absolute: 80 cells/uL (ref 15–500)
Eosinophils Relative: 1.9 %
HCT: 41.6 % (ref 35.0–45.0)
Hemoglobin: 14.2 g/dL (ref 11.7–15.5)
Lymphs Abs: 1336 cells/uL (ref 850–3900)
MCH: 31.1 pg (ref 27.0–33.0)
MCHC: 34.1 g/dL (ref 32.0–36.0)
MCV: 91.2 fL (ref 80.0–100.0)
MPV: 10.3 fL (ref 7.5–12.5)
Monocytes Relative: 12.3 %
Neutro Abs: 2247 cells/uL (ref 1500–7800)
Neutrophils Relative %: 53.5 %
Platelets: 261 10*3/uL (ref 140–400)
RBC: 4.56 10*6/uL (ref 3.80–5.10)
RDW: 13.3 % (ref 11.0–15.0)
Total Lymphocyte: 31.8 %
WBC: 4.2 10*3/uL (ref 3.8–10.8)

## 2018-12-02 LAB — URINALYSIS, MICROSCOPIC ONLY
Bacteria, UA: NONE SEEN /HPF
Hyaline Cast: NONE SEEN /LPF
RBC / HPF: NONE SEEN /HPF (ref 0–2)

## 2018-12-02 LAB — COMPLETE METABOLIC PANEL WITH GFR
AG Ratio: 1.7 (calc) (ref 1.0–2.5)
ALT: 23 U/L (ref 6–29)
AST: 21 U/L (ref 10–35)
Albumin: 4.7 g/dL (ref 3.6–5.1)
Alkaline phosphatase (APISO): 53 U/L (ref 37–153)
BUN: 12 mg/dL (ref 7–25)
CO2: 27 mmol/L (ref 20–32)
Calcium: 9.9 mg/dL (ref 8.6–10.4)
Chloride: 102 mmol/L (ref 98–110)
Creat: 0.68 mg/dL (ref 0.50–0.99)
GFR, Est African American: 109 mL/min/{1.73_m2} (ref >=60)
GFR, Est Non African American: 94 mL/min/{1.73_m2} (ref >=60)
Globulin: 2.7 g/dL (calc) (ref 1.9–3.7)
Glucose, Bld: 104 mg/dL — ABNORMAL HIGH (ref 65–99)
Potassium: 3.9 mmol/L (ref 3.5–5.3)
Sodium: 139 mmol/L (ref 135–146)
Total Bilirubin: 1 mg/dL (ref 0.2–1.2)
Total Protein: 7.4 g/dL (ref 6.1–8.1)

## 2018-12-02 NOTE — Progress Notes (Signed)
No RBC.  No WBC. No Bacteria seen in culture/microscopic exam.   Kidney and liver function good.  No anemia.   Good signs. Will call when get ultrasound results.

## 2018-12-03 ENCOUNTER — Encounter: Payer: Self-pay | Admitting: Physician Assistant

## 2018-12-03 DIAGNOSIS — D259 Leiomyoma of uterus, unspecified: Secondary | ICD-10-CM | POA: Insufficient documentation

## 2018-12-03 LAB — URINE CULTURE
MICRO NUMBER:: 985229
SPECIMEN QUALITY:: ADEQUATE

## 2018-12-03 NOTE — Progress Notes (Signed)
Laquitta,   Small uterine fibroids were noted but no other abnormalities. I have made referral to urology. That is the next step. Let me know if any symptoms change or worsen before you make it to that appointment.   -Luvenia Starch

## 2019-01-05 ENCOUNTER — Telehealth: Payer: Self-pay | Admitting: Osteopathic Medicine

## 2019-01-05 ENCOUNTER — Encounter: Payer: Self-pay | Admitting: Advanced Practice Midwife

## 2019-01-05 ENCOUNTER — Other Ambulatory Visit: Payer: Self-pay

## 2019-01-05 ENCOUNTER — Ambulatory Visit (INDEPENDENT_AMBULATORY_CARE_PROVIDER_SITE_OTHER): Payer: 59 | Admitting: Advanced Practice Midwife

## 2019-01-05 VITALS — BP 119/72 | HR 75 | Resp 16 | Ht 65.0 in | Wt 145.0 lb

## 2019-01-05 DIAGNOSIS — Z01419 Encounter for gynecological examination (general) (routine) without abnormal findings: Secondary | ICD-10-CM | POA: Diagnosis not present

## 2019-01-05 DIAGNOSIS — Z124 Encounter for screening for malignant neoplasm of cervix: Secondary | ICD-10-CM | POA: Diagnosis not present

## 2019-01-05 DIAGNOSIS — Z1151 Encounter for screening for human papillomavirus (HPV): Secondary | ICD-10-CM

## 2019-01-05 NOTE — Telephone Encounter (Signed)
Forwarding to provider for review.

## 2019-01-05 NOTE — Progress Notes (Signed)
Subjective:     Teresa Morton is a 63 y.o. female here for a routine annual exam. Patient has no complaints today.  Personal health questionnaire reviewed: yes.  Do you have a primary care provider? Yes How many times per week do you exercise? Daily Do you feel safe at home? Yes Has anyone hit, slapped, or kicked you recently? No Do you feel sad, tired, or upset most days or are you mostly happy with life? No   Gynecologic History No LMP recorded. Patient is postmenopausal. Contraception: none, s/p salpingoophorectomy 2007 Last Pap: unknown, 3-4 yrs ago per patient Results were: normal per patient Last mammogram: 10/2018. Results were: normal  Obstetric History OB History  Gravida Para Term Preterm AB Living  3 3 3         SAB TAB Ectopic Multiple Live Births               # Outcome Date GA Lbr Len/2nd Weight Sex Delivery Anes PTL Lv  3 Term      Vag-Spont     2 Term      Vag-Spont     1 Term      Vag-Spont        The following portions of the patient's history were reviewed and updated as appropriate: allergies, past social history, past surgical history and problem list.  Review of Systems Constitutional: negative Eyes: negative Respiratory: negative Cardiovascular: negative Gastrointestinal: negative Genitourinary:negative Integument/breast: negative Musculoskeletal:negative Neurological: negative Behavioral/Psych: negative Endocrine: negative    Objective:    BP 119/72   Pulse 75   Resp 16   Ht 5\' 5"  (1.651 m)   Wt 65.8 kg   BMI 24.13 kg/m   VS reviewed, nursing note reviewed,  Constitutional: well developed, well nourished, no distress HEENT: normocephalic CV: normal rate Pulm/chest wall: normal effort Abdomen: soft Pelvic exam: normal external genitalia, vulva, vagina, cervix, uterus and adnexa  Normal bimanual exam  VAGINA: normal appearing vagina with normal color and discharge, no lesions,  CERVIX: normal appearing cervix without discharge  or lesions, cervical motion  tenderness absent  PAP: Pap smear done today. Neuro: alert and oriented x 3 Skin: warm, dry Psych: affect normal    Assessment/Plan:    1. Well woman exam with routine gynecological exam - Continue current medications, patient up to date with flu vaccine - Cytology - PAP( [place] [facility]) taken today. Patient up to date with mammogram.     Follow up in: 1 year or as needed.   Hessie Dibble, Student-PA 10:43 AM

## 2019-01-05 NOTE — Patient Instructions (Signed)
Preventive Care 27-61 Years Old, Female Preventive care refers to visits with your health care provider and lifestyle choices that can promote health and wellness. This includes:  A yearly physical exam. This may also be called an annual well check.  Regular dental visits and eye exams.  Immunizations.  Screening for certain conditions.  Healthy lifestyle choices, such as eating a healthy diet, getting regular exercise, not using drugs or products that contain nicotine and tobacco, and limiting alcohol use. What can I expect for my preventive care visit? Physical exam Your health care provider will check your:  Height and weight. This may be used to calculate body mass index (BMI), which tells if you are at a healthy weight.  Heart rate and blood pressure.  Skin for abnormal spots. Counseling Your health care provider may ask you questions about your:  Alcohol, tobacco, and drug use.  Emotional well-being.  Home and relationship well-being.  Sexual activity.  Eating habits.  Work and work Statistician.  Method of birth control.  Menstrual cycle.  Pregnancy history. What immunizations do I need?  Influenza (flu) vaccine  This is recommended every year. Tetanus, diphtheria, and pertussis (Tdap) vaccine  You may need a Td booster every 10 years. Varicella (chickenpox) vaccine  You may need this if you have not been vaccinated. Zoster (shingles) vaccine  You may need this after age 71. Measles, mumps, and rubella (MMR) vaccine  You may need at least one dose of MMR if you were born in 1957 or later. You may also need a second dose. Pneumococcal conjugate (PCV13) vaccine  You may need this if you have certain conditions and were not previously vaccinated. Pneumococcal polysaccharide (PPSV23) vaccine  You may need one or two doses if you smoke cigarettes or if you have certain conditions. Meningococcal conjugate (MenACWY) vaccine  You may need this if you  have certain conditions. Hepatitis A vaccine  You may need this if you have certain conditions or if you travel or work in places where you may be exposed to hepatitis A. Hepatitis B vaccine  You may need this if you have certain conditions or if you travel or work in places where you may be exposed to hepatitis B. Haemophilus influenzae type b (Hib) vaccine  You may need this if you have certain conditions. Human papillomavirus (HPV) vaccine  If recommended by your health care provider, you may need three doses over 6 months. You may receive vaccines as individual doses or as more than one vaccine together in one shot (combination vaccines). Talk with your health care provider about the risks and benefits of combination vaccines. What tests do I need? Blood tests  Lipid and cholesterol levels. These may be checked every 5 years, or more frequently if you are over 61 years old.  Hepatitis C test.  Hepatitis B test. Screening  Lung cancer screening. You may have this screening every year starting at age 62 if you have a 30-pack-year history of smoking and currently smoke or have quit within the past 15 years.  Colorectal cancer screening. All adults should have this screening starting at age 13 and continuing until age 65. Your health care provider may recommend screening at age 34 if you are at increased risk. You will have tests every 1-10 years, depending on your results and the type of screening test.  Diabetes screening. This is done by checking your blood sugar (glucose) after you have not eaten for a while (fasting). You may have this  done every 1-3 years.  Mammogram. This may be done every 1-2 years. Talk with your health care provider about when you should start having regular mammograms. This may depend on whether you have a family history of breast cancer.  BRCA-related cancer screening. This may be done if you have a family history of breast, ovarian, tubal, or peritoneal  cancers.  Pelvic exam and Pap test. This may be done every 3 years starting at age 21. Starting at age 30, this may be done every 5 years if you have a Pap test in combination with an HPV test. Other tests  Sexually transmitted disease (STD) testing.  Bone density scan. This is done to screen for osteoporosis. You may have this scan if you are at high risk for osteoporosis. Follow these instructions at home: Eating and drinking  Eat a diet that includes fresh fruits and vegetables, whole grains, lean protein, and low-fat dairy.  Take vitamin and mineral supplements as recommended by your health care provider.  Do not drink alcohol if: ? Your health care provider tells you not to drink. ? You are pregnant, may be pregnant, or are planning to become pregnant.  If you drink alcohol: ? Limit how much you have to 0-1 drink a day. ? Be aware of how much alcohol is in your drink. In the [place] one drink equals one 12 oz bottle of beer (355 mL), one 5 oz glass of wine (148 mL), or one 1 oz glass of hard liquor (44 mL). Lifestyle  Take daily care of your teeth and gums.  Stay active. Exercise for at least 30 minutes on 5 or more days each week.  Do not use any products that contain nicotine or tobacco, such as cigarettes, e-cigarettes, and chewing tobacco. If you need help quitting, ask your health care provider.  If you are sexually active, practice safe sex. Use a condom or other form of birth control (contraception) in order to prevent pregnancy and STIs (sexually transmitted infections).  If told by your health care provider, take low-dose aspirin daily starting at age 50. What's next?  Visit your health care provider once a year for a well check visit.  Ask your health care provider how often you should have your eyes and teeth checked.  Stay up to date on all vaccines. This information is not intended to replace advice given to you by your health care provider. Make sure you  discuss any questions you have with your health care provider. Document Released: 03/03/2015 Document Revised: 10/16/2017 Document Reviewed: 10/16/2017 Elsevier Patient Education  2020 Elsevier Inc.  

## 2019-01-05 NOTE — Telephone Encounter (Signed)
PT came into the office requesting a referral for a MRI. This is pertaining her abdominal issues. She has been seen in office about this issue.   Please Advise.

## 2019-01-06 LAB — CYTOLOGY - PAP
Comment: NEGATIVE
Diagnosis: NEGATIVE
High risk HPV: NEGATIVE

## 2019-01-07 NOTE — Telephone Encounter (Signed)
Teresa Morton and I did not discuss anything to do with MRI at her last visit.  Teresa Morton recently saw [facility] for this issue, it looks like Teresa Morton is under the care of a urologist, if patient is having other issues or requesting some kind of additional testing, Teresa Morton needs a visit

## 2019-01-07 NOTE — Telephone Encounter (Signed)
I called the patient and she does not want to do any more procedures at this time. She will call at a later time and let us know how she wants to proceed.

## 2019-01-19 ENCOUNTER — Telehealth: Payer: Self-pay

## 2019-01-19 MED ORDER — FLUCONAZOLE 150 MG PO TABS: 150.0000 mg | ORAL_TABLET | Freq: Once | ORAL | 1 refills | Status: AC

## 2019-01-19 NOTE — Telephone Encounter (Signed)
Diflucan sent, any further issues she will need a visit/contact her surgeon

## 2019-01-19 NOTE — Telephone Encounter (Signed)
Pt has been updated and made aware of provider's note. Pt did request to make a virtual appt with provider to discuss possible MRI referral/second opinion regarding her surgical procedure. Pt has been transferred to scheduler's desk to schedule an appt. No other inquiries during call.

## 2019-01-19 NOTE — Telephone Encounter (Signed)
Pt left a vm msg stating she had surgery for kidney stones removal. As per pt, she was informed that she also had a UTI. Pt was given abx for symptoms. She is now having yeast like symptoms. Requesting if provider can send in a rx to [facility] in [place]. Pt currently taking Azo, but it seems to making symptoms worse. Pls advise, thanks.

## 2019-02-02 ENCOUNTER — Encounter: Payer: Self-pay | Admitting: Osteopathic Medicine

## 2019-02-02 ENCOUNTER — Ambulatory Visit (INDEPENDENT_AMBULATORY_CARE_PROVIDER_SITE_OTHER): Payer: 59 | Admitting: Osteopathic Medicine

## 2019-02-02 VITALS — Wt 135.0 lb

## 2019-02-02 DIAGNOSIS — R932 Abnormal findings on diagnostic imaging of liver and biliary tract: Secondary | ICD-10-CM | POA: Diagnosis not present

## 2019-02-02 DIAGNOSIS — R935 Abnormal findings on diagnostic imaging of other abdominal regions, including retroperitoneum: Secondary | ICD-10-CM

## 2019-02-02 NOTE — Progress Notes (Signed)
Virtual Visit via Phone Note  I connected with      Teresa Morton on 02/02/19 at 11:00 AM  by a telemedicine application and verified that I am speaking with the correct person using two identifiers.  Patient is at home I am in office   I discussed the limitations of evaluation and management by telemedicine and the availability of in person appointments. The patient expressed understanding and agreed to proceed.  History of Present Illness: Teresa Morton is a 63 y.o. female who would like to discuss MRI liver   Abnormal CT on ER visit for kidney stone, 12/06/18  "Liver: The liver is diffusely low in attenuation, consistent with steatosis. There is a 1.9 cm lesion in the superior left hepatic lobe (image 22) with density greater than simple fluid. Subcentimeter hypodensity just to the gallbladder fossa (image  49) is too small to characterize. Patent hepatic veins and portal veins."     Observations/Objective: Wt 135 lb (61.2 kg)   BMI 22.47 kg/m  BP Readings from Last 3 Encounters:  01/05/19 119/72  12/01/18 (!) 137/98  11/23/18 (!) 144/82   Exam: Normal Speech.  NAD  Lab and Radiology Results No results found for this or any previous visit (from the past 72 hour(s)). No results found.     Assessment and Plan: 63 y.o. female with The primary encounter diagnosis was Abnormal CT of the abdomen. A diagnosis of Abnormal liver CT was also pertinent to this visit.  Pt advised to call [facility] records and get disc of images if she can.   PDMP not reviewed this encounter. Orders Placed This Encounter  Procedures  . MR LIVER W WO CONTRAST    Order Specific Question:   ** REASON FOR EXAM (FREE TEXT)    Answer:   liver mass on CT Abdomen at [facility]    Order Specific Question:   If indicated for the ordered procedure, I authorize the administration of contrast media per Radiology protocol    Answer:   Yes    Order Specific Question:   What is the patient's  sedation requirement?    Answer:   No Sedation    Order Specific Question:   Does the patient have a pacemaker or implanted devices?    Answer:   No    Order Specific Question:   Radiology Contrast Protocol - do NOT remove file path    Answer:   [email or web address]    Order Specific Question:   Preferred imaging location?    Answer:   Product/process development scientist (table limit-350lbs)      Follow Up Instructions: Return for RECHECK PENDING MRI RESULTS .    I discussed the assessment and treatment plan with the patient. The patient was provided an opportunity to ask questions and all were answered. The patient agreed with the plan and demonstrated an understanding of the instructions.   The patient was advised to call back or seek an in-person evaluation if any new concerns, if symptoms worsen or if the condition fails to improve as anticipated.  21 minutes of non-face-to-face time was provided during this encounter.      . . . . . . . . . . . . . Marland Kitchen                   Historical information moved to improve visibility of documentation.  Past Medical History:  Diagnosis Date  . GERD (gastroesophageal reflux disease)   . Melanoma in situ of  trunk [facility])    Past Surgical History:  Procedure Laterality Date  . CHOLECYSTECTOMY, LAPAROSCOPIC  2005  . SALPINGOOPHORECTOMY  2007   Social History   Tobacco Use  . Smoking status: Former Smoker    Quit date: 1984    Years since quitting: 36.9  . Smokeless tobacco: Never Used  Substance Use Topics  . Alcohol use: Never   family history includes Alcoholism in her father; Liver cancer in her mother.  Medications: Current Outpatient Medications  Medication Sig Dispense Refill  . Biotin 1000 MCG tablet Take 1,000 mcg by mouth 3 (three) times daily.    . cholecalciferol (VITAMIN D) 1000 units tablet Take 1,000 Units by mouth daily.    Marland Kitchen dicyclomine (BENTYL) 20 MG tablet Take 1 tablet  (20 mg total) by mouth 3 (three) times daily as needed for spasms (abdominal pain). 30 tablet 0  . diphenoxylate-atropine (LOMOTIL) 2.5-0.025 MG tablet Take 1-2 tablets by mouth 4 (four) times daily as needed for diarrhea or loose stools (take prior to lunch / as needed). (Patient not taking: Reported on 02/02/2019) 30 tablet 0  . ondansetron (ZOFRAN ODT) 4 MG disintegrating tablet Take one tab by mouth Q6hr prn nausea.  Dissolve under tongue. (Patient not taking: Reported on 02/02/2019) 12 tablet 0  . traZODone (DESYREL) 50 MG tablet Take 0.5-3 tablets (25-150 mg total) by mouth at bedtime as needed for sleep. Start at lowest dose, increase every 2-3 nights until effective sleep achieved, use lowest effective dose. (Patient not taking: Reported on 02/02/2019) 90 tablet 1   No current facility-administered medications for this visit.   Allergies  Allergen Reactions  . Codeine Nausea And Vomiting    Vomiting

## 2019-02-09 ENCOUNTER — Telehealth: Payer: Self-pay

## 2019-02-09 MED ORDER — DIAZEPAM 2 MG PO TABS: 2.0000 mg | ORAL_TABLET | Freq: Once | ORAL | 0 refills | Status: AC

## 2019-02-09 NOTE — Telephone Encounter (Signed)
Sent valium

## 2019-02-09 NOTE — Telephone Encounter (Signed)
Pre med needed for MRI

## 2019-02-15 ENCOUNTER — Other Ambulatory Visit: Payer: Self-pay

## 2019-02-15 ENCOUNTER — Ambulatory Visit (INDEPENDENT_AMBULATORY_CARE_PROVIDER_SITE_OTHER): Payer: 59 | Admitting: Family Medicine

## 2019-02-15 ENCOUNTER — Encounter: Payer: Self-pay | Admitting: Family Medicine

## 2019-02-15 VITALS — BP 130/76 | HR 93 | Temp 97.9°F | Ht 66.0 in | Wt 146.0 lb

## 2019-02-15 DIAGNOSIS — K769 Liver disease, unspecified: Secondary | ICD-10-CM | POA: Diagnosis not present

## 2019-02-15 DIAGNOSIS — Z87442 Personal history of urinary calculi: Secondary | ICD-10-CM

## 2019-02-15 LAB — BASIC METABOLIC PANEL
BUN: 15 mg/dL (ref 6–23)
CO2: 29 mEq/L (ref 19–32)
Calcium: 9.6 mg/dL (ref 8.4–10.5)
Chloride: 103 mEq/L (ref 96–112)
Creatinine, Ser: 0.66 mg/dL (ref 0.40–1.20)
GFR: 90.81 mL/min (ref >60.00)
Glucose, Bld: 94 mg/dL (ref 70–99)
Potassium: 3.7 mEq/L (ref 3.5–5.1)
Sodium: 139 mEq/L (ref 135–145)

## 2019-02-15 NOTE — Progress Notes (Signed)
Chief Complaint  Patient presents with  . New Patient (Initial Visit)    review CT results//need MRI??       New Patient Visit SUBJECTIVE: HPI: Teresa Morton is an 63 y.o.female who is being seen for establishing care.  The patient was previously seen at [facility].  The patient has a history of recurrent kidney stones.  She had one most recently in October 2020.  CT scan without contrast showed a kidney stone in addition to a lesion of the left hepatic lobe.  She has never had any abnormal liver testing.  She does have a history of oral contraceptive use.  MRI was recommended which she has scheduled tomorrow.  She would like my opinion on whether she should have this done.  There was never any injury to her liver.  She is up-to-date with her cancer screening surveillance.  She is not having any abdominal pain, nausea, or vomiting.  Past Medical History:  Diagnosis Date  . GERD (gastroesophageal reflux disease)   . Melanoma in situ of trunk [facility])    Past Surgical History:  Procedure Laterality Date  . CHOLECYSTECTOMY, LAPAROSCOPIC  2005  . SALPINGOOPHORECTOMY  2007   Family History  Problem Relation Age of Onset  . Liver cancer Mother   . Alcoholism Father   . Colon cancer Neg Hx   . Esophageal cancer Neg Hx   . Rectal cancer Neg Hx   . Stomach cancer Neg Hx    Allergies  Allergen Reactions  . Codeine Nausea And Vomiting    Vomiting      Current Outpatient Medications:  .  Biotin 1000 MCG tablet, Take 1,000 mcg by mouth 3 (three) times daily., Disp: , Rfl:   ROS Const: Denies fevers  GI: Denies pain   OBJECTIVE: BP 130/76 (BP Location: Left Arm, Patient Position: Sitting, Cuff Size: Normal)   Pulse 93   Temp 97.9 F (36.6 C) (Temporal)   Ht 5\' 6"  (1.676 m)   Wt 146 lb (66.2 kg)   SpO2 97%   BMI 23.57 kg/m  General:  well developed, well nourished, in no apparent distress Skin:  no significant moles, warts, or growths Head:  no masses,  lesions, or tenderness Eyes:  pupils equal and round, sclera anicteric without injection Ears:  canals without lesions, TMs shiny without retraction, no obvious effusion, no erythema Nose:  nares patent, septum midline, mucosa normal Lungs:  clear to auscultation, breath sounds equal bilaterally, no respiratory distress Cardio:  regular rate and rhythm, no LE edema or bruits GI: BS+, S, NT, ND, no masses or organomegaly Neuro:  gait normal Psych: well oriented with normal range of affect and appropriate judgment/insight  ASSESSMENT/PLAN: Hepatic lesion - Plan: Basic Metabolic Panel (BMET)  History of kidney stones  Ck renal function for scan. Rec'd she undergo tomorrow as planned. Likely an adenoma given OCP use.  Low purine diet discussed and written down.  Patient should return for CPE at convenience. The patient voiced understanding and agreement to the plan.   [place], DO 02/15/19  12:09 PM

## 2019-02-15 NOTE — Patient Instructions (Addendum)
I would go through with the scan tomorrow.   This is likely nothing sinister, especially given your history of OCP use.   Let us know if you need anything.  Low-Purine Eating Plan A low-purine eating plan involves making food choices to limit your intake of purine. Purine is a kind of uric acid. Too much uric acid in your blood can cause certain conditions, such as gout and kidney stones. Eating a low-purine diet can help control these conditions. What are tips for following this plan? Reading food labels   Avoid foods with saturated or Trans fat.  Check the ingredient list of grains-based foods, such as bread and cereal, to make sure that they contain whole grains.  Check the ingredient list of sauces or soups to make sure they do not contain meat or fish.  When choosing soft drinks, check the ingredient list to make sure they do not contain high-fructose corn syrup. Shopping  Buy plenty of fresh fruits and vegetables.  Avoid buying canned or fresh fish.  Buy dairy products labeled as low-fat or nonfat.  Avoid buying premade or processed foods. These foods are often high in fat, salt (sodium), and added sugar. Cooking  Use olive oil instead of butter when cooking. Oils like olive oil, canola oil, and sunflower oil contain healthy fats. Meal planning  Learn which foods do or do not affect you. If you find out that a food tends to cause your gout symptoms to flare up, avoid eating that food. You can enjoy foods that do not cause problems. If you have any questions about a food item, talk with your dietitian or health care provider.  Limit foods high in fat, especially saturated fat. Fat makes it harder for your body to get rid of uric acid.  Choose foods that are lower in fat and are lean sources of protein. General guidelines  Limit alcohol intake to no more than 1 drink a day for nonpregnant women and 2 drinks a day for men. One drink equals 12 oz of beer, 5 oz of wine, or 1  oz of hard liquor. Alcohol can affect the way your body gets rid of uric acid.  Drink plenty of water to keep your urine clear or pale yellow. Fluids can help remove uric acid from your body.  If directed by your health care provider, take a vitamin C supplement.  Work with your health care provider and dietitian to develop a plan to achieve or maintain a healthy weight. Losing weight can help reduce uric acid in your blood. What foods are recommended? The items listed may not be a complete list. Talk with your dietitian about what dietary choices are best for you. Foods low in purines Foods low in purines do not need to be limited. These include:  All fruits.  All low-purine vegetables, pickles, and olives.  Breads, pasta, rice, cornbread, and popcorn. Cake and other baked goods.  All dairy foods.  Eggs, nuts, and nut butters.  Spices and condiments, such as salt, herbs, and vinegar.  Plant oils, butter, and margarine.  Water, sugar-free soft drinks, tea, coffee, and cocoa.  Vegetable-based soups, broths, sauces, and gravies. Foods moderate in purines Foods moderate in purines should be limited to the amounts listed.   cup of asparagus, cauliflower, spinach, mushrooms, or green peas, each day.  2/3 cup uncooked oatmeal, each day.   cup dry wheat bran or wheat germ, each day.  2-3 ounces of meat or poultry, each day.  4-6 ounces of shellfish, such as crab, lobster, oysters, or shrimp, each day.  1 cup cooked beans, peas, or lentils, each day.  Soup, broths, or bouillon made from meat or fish. Limit these foods as much as possible. What foods are not recommended? The items listed may not be a complete list. Talk with your dietitian about what dietary choices are best for you. Limit your intake of foods high in purines, including:  Beer and other alcohol.  Meat-based gravy or sauce.  Canned or fresh fish, such as: ? Anchovies, sardines, herring, and  tuna. ? Mussels and scallops. ? Codfish, trout, and haddock.  Berniece Salines.  Organ meats, such as: ? Liver or kidney. ? Tripe. ? Sweetbreads (thymus gland or pancreas).  Wild Clinical biochemist.  Yeast or yeast extract supplements.  Drinks sweetened with high-fructose corn syrup. Summary  Eating a low-purine diet can help control conditions caused by too much uric acid in the body, such as gout or kidney stones.  Choose low-purine foods, limit alcohol, and limit foods high in fat.  You will learn over time which foods do or do not affect you. If you find out that a food tends to cause your gout symptoms to flare up, avoid eating that food. This information is not intended to replace advice given to you by your health care provider. Make sure you discuss any questions you have with your health care provider. Document Released: 06/01/2010 Document Revised: 01/17/2017 Document Reviewed: 03/20/2016 Elsevier Patient Education  2020 [employer].

## 2019-02-16 ENCOUNTER — Ambulatory Visit (INDEPENDENT_AMBULATORY_CARE_PROVIDER_SITE_OTHER): Payer: 59

## 2019-02-16 DIAGNOSIS — R932 Abnormal findings on diagnostic imaging of liver and biliary tract: Secondary | ICD-10-CM | POA: Diagnosis not present

## 2019-02-16 DIAGNOSIS — R935 Abnormal findings on diagnostic imaging of other abdominal regions, including retroperitoneum: Secondary | ICD-10-CM | POA: Diagnosis not present

## 2019-02-16 MED ORDER — GADOBUTROL 1 MMOL/ML IV SOLN
6.5000 mL | Freq: Once | INTRAVENOUS | Status: AC | PRN
  Administered 2019-02-16: 6.5 mL via INTRAVENOUS

## 2019-10-07 ENCOUNTER — Encounter: Payer: Self-pay | Admitting: Family Medicine

## 2019-10-07 ENCOUNTER — Ambulatory Visit: Payer: Self-pay

## 2019-10-07 ENCOUNTER — Ambulatory Visit (INDEPENDENT_AMBULATORY_CARE_PROVIDER_SITE_OTHER): Payer: 59 | Admitting: Family Medicine

## 2019-10-07 ENCOUNTER — Other Ambulatory Visit: Payer: Self-pay

## 2019-10-07 VITALS — BP 123/86 | HR 92 | Ht 67.0 in | Wt 140.0 lb

## 2019-10-07 DIAGNOSIS — M79662 Pain in left lower leg: Secondary | ICD-10-CM | POA: Diagnosis not present

## 2019-10-07 DIAGNOSIS — M7711 Lateral epicondylitis, right elbow: Secondary | ICD-10-CM | POA: Diagnosis not present

## 2019-10-07 DIAGNOSIS — M170 Bilateral primary osteoarthritis of knee: Secondary | ICD-10-CM | POA: Insufficient documentation

## 2019-10-07 NOTE — Progress Notes (Signed)
Teresa Morton - 64 y.o. female MRN [id number]  Date of birth: 1955/04/16  SUBJECTIVE:  Including CC & ROS.  Chief Complaint  Patient presents with  . Elbow Pain    right  . Leg Pain    left calf    Teresa Morton is a 64 y.o. female that is presenting with right elbow pain.  This pain is occurring over the lateral aspect.  Denies any injury or inciting event.  No history of similar pain.  Intermittent in nature.  Having posterior knee pain.  She does have some radiation in the gastrocnemius.  Does have some pain in the hamstring as well.  Seem to acutely worsen.  Has been dealing with for 3 to 4 years.  Had some giving way from time to time.  Does get improvement with the compression..    Review of Systems See HPI   HISTORY: Past Medical, Surgical, Social, and Family History Reviewed & Updated per EMR.   Pertinent Historical Findings include:  Past Medical History:  Diagnosis Date  . GERD (gastroesophageal reflux disease)   . Melanoma in situ of trunk [facility])     Past Surgical History:  Procedure Laterality Date  . CHOLECYSTECTOMY, LAPAROSCOPIC  2005  . SALPINGOOPHORECTOMY  2007    Family History  Problem Relation Age of Onset  . Liver cancer Mother   . Alcoholism Father   . Colon cancer Neg Hx   . Esophageal cancer Neg Hx   . Rectal cancer Neg Hx   . Stomach cancer Neg Hx     Social History   Socioeconomic History  . Marital status: Married    Spouse name: Not on file  . Number of children: 3  . Years of education: Not on file  . Highest education level: Not on file  Occupational History  . Occupation: Actuary  . 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 Smoking status: Former Smoker    Quit date: 1984    Years since quitting: 37.6  . Smokeless tobacco: Never Used  Vaping Use  . Vaping Use: Never used  Substance and Sexual Activity  . Alcohol use: Never  . Drug use: Never  . Sexual activity: Yes    Partners: Male     Birth control/protection: None  Other Topics Concern  . Not on file  Social History Narrative  . Not on file   Social Determinants of Health   Financial Resource Strain:   . Difficulty of Paying Living Expenses: Not on file  Food Insecurity:   . Worried About Charity fundraiser in the Last Year: Not on file  . Ran Out of Food in the Last Year: Not on file  Transportation Needs:   . Lack of Transportation (Medical): Not on file  . Lack of Transportation (Non-Medical): Not on file  Physical Activity:   . Days of Exercise per Week: Not on file  . Minutes of Exercise per Session: Not on file  Stress:   . Feeling of Stress : Not on file  Social Connections:   . Frequency of Communication with Friends and Family: Not on file  . Frequency of Social Gatherings with Friends and Family: Not on file  . Attends Religious Services: Not on file  . Active Member of Clubs or Organizations: Not on file  . Attends Archivist Meetings: Not on file  . Marital Status: Not on file  Intimate Partner Violence:   .  Fear of Current or Ex-Partner: Not on file  . Emotionally Abused: Not on file  . Physically Abused: Not on file  . Sexually Abused: Not on file     PHYSICAL EXAM:  VS: BP 123/86   Pulse 92   Ht 5\' 7"  (1.702 m)   Wt 140 lb (63.5 kg)   BMI 21.93 kg/m  Physical Exam Gen: NAD, alert, cooperative with exam, well-appearing MSK:  Right elbow: Normal range of motion. Normal strength resistance. Tenderness palpation of the lateral condyle. Left knee: No effusion. Normal range of motion. Pain exacerbated with weightbearing and flexion in a Thessaly test Normal strength resistance. Neurovascularly intact  Limited ultrasound: Right elbow, left leg:  Right elbow: Mild spurring appearing at the lateral condyle. No increased hyperemia over the common extensor origin. No joint effusion.  Left leg: No obvious effusion in the suprapatellar pouch. No changes at the  musculotendinous junction of the gastrocnemius and the Achilles. Normal-appearing interventricular septum of the medial and lateral gastrocnemius. No changes appreciated of the distal hamstring.  Summary: Minor degenerative changes appreciated at the lateral condyle.  No changes appreciated in the posterior knee.  Ultrasound and interpretation by Clearance Coots, MD   ASSESSMENT & PLAN:   Lateral epicondylitis of right elbow Clinical exam would suggest tennis elbow.  No significant changes of the tendon on ultrasound. -Counseled on home exercise therapy and supportive care. -Pennsaid samples. -Referral to physical therapy. -Could consider injection.  Pain of left calf Having pain in the posterior aspect of the leg.  Concern with distal hamstring versus calf strain.  Could be associated with the meniscus posteriorly as well.  Having symptom improvement with conservative therapy thus far. -Counseled on home exercise therapy and supportive care. -Referral to physical therapy. -Could consider knee injection or further imaging.

## 2019-10-07 NOTE — Assessment & Plan Note (Signed)
Clinical exam would suggest tennis elbow.  No significant changes of the tendon on ultrasound. -Counseled on home exercise therapy and supportive care. -Pennsaid samples. -Referral to physical therapy. -Could consider injection.

## 2019-10-07 NOTE — Patient Instructions (Signed)
Nice to meet you Please try compression on the knee  Please try the exercises.  Please alternate ice and heat  Please try the exercises for the elbow  Please try the rub on medicine   Please send me a message in MyChart with any questions or updates.  Please see me back in 4 weeks.   --Dr. Raeford Razor

## 2019-10-07 NOTE — Assessment & Plan Note (Signed)
Having pain in the posterior aspect of the leg.  Concern with distal hamstring versus calf strain.  Could be associated with the meniscus posteriorly as well.  Having symptom improvement with conservative therapy thus far. -Counseled on home exercise therapy and supportive care. -Referral to physical therapy. -Could consider knee injection or further imaging.

## 2019-10-08 NOTE — Progress Notes (Signed)
Medication Samples have been provided to the patient.  Drug name: Pennsaid       Strength: 2%        Qty: 2 boxes  LOT: [id number]  Exp.Date: 04/2020  Dosing instructions: use a pea size amount and rub gently.  The patient has been instructed regarding the correct time, dose, and frequency of taking this medication, including desired effects and most common side effects.   Sherrie George, MA 8:39 AM 10/08/2019

## 2019-10-12 ENCOUNTER — Ambulatory Visit (INDEPENDENT_AMBULATORY_CARE_PROVIDER_SITE_OTHER): Payer: 59 | Admitting: Physical Therapy

## 2019-10-12 ENCOUNTER — Other Ambulatory Visit: Payer: Self-pay

## 2019-10-12 ENCOUNTER — Encounter: Payer: Self-pay | Admitting: Physical Therapy

## 2019-10-12 DIAGNOSIS — M25521 Pain in right elbow: Secondary | ICD-10-CM

## 2019-10-12 DIAGNOSIS — M25562 Pain in left knee: Secondary | ICD-10-CM

## 2019-10-12 DIAGNOSIS — G8929 Other chronic pain: Secondary | ICD-10-CM | POA: Diagnosis not present

## 2019-10-12 NOTE — Patient Instructions (Signed)
Access Code: [email or web address] URL: [email or web address] Date: 10/12/2019 Prepared by: Almyra Free  Exercises Doorway Piriformis Stretch - 2 x daily - 7 x weekly - 1 sets - 3 reps - 30-60 sec hold Supine ITB Stretch with Strap - 2 x daily - 7 x weekly - 1 sets - 3 reps - 30-6060 sec hold Seated Wrist Flexion Stretch - 1 x daily - 7 x weekly - 1 sets - 3 reps - 30-60 sec hold Standing Gastroc Stretch - 2 x daily - 7 x weekly - 1 sets - 3 reps - 30-60 sec hold  Patient Education Trigger Point Dry Needling Ionto Patient Instructions

## 2019-10-12 NOTE — Therapy (Signed)
[address] [address] [place], [place], [address] Phone: [phone]   Fax:  [phone]  Physical Therapy Evaluation  Patient Details  Name: Teresa Morton MRN: [id number] Date of Birth: [birth year] Referring Provider (PT): Clearance Coots   Encounter Date: 10/12/2019   PT End of Session - 10/12/19 1051    Visit Number 1    Date for PT Re-Evaluation 11/23/19    Authorization Type UHC    PT Start Time 1050    PT Stop Time 1157   10 min heat at end   PT Time Calculation (min) 67 min    Activity Tolerance Patient tolerated treatment well    Behavior During Therapy [facility] for tasks assessed/performed           Past Medical History:  Diagnosis Date  . GERD (gastroesophageal reflux disease)   . Melanoma in situ of trunk [facility])     Past Surgical History:  Procedure Laterality Date  . CHOLECYSTECTOMY, LAPAROSCOPIC  2005  . SALPINGOOPHORECTOMY  2007    There were no vitals filed for this visit.    Subjective Assessment - 10/12/19 1052    Subjective Patient has had left calf pain for about 4-5 years. She thinks she was pulling weeds and was on her knees for hours. Only hurts now when sitting on heels and at night she'll feel it when she turns her leg a certain way. Last Saturday it gave out when she was walking and then couldn't put weight on it for one day. Her right elbow has been hurting since early July helping empty 2 storage units. It wakes her from sleeping if on it.    Currently in Pain? Yes    Pain Score 4     Pain Location Calf    Pain Orientation Left    Pain Descriptors / Indicators Sharp;Cramping    Pain Type Chronic pain    Pain Onset More than a month ago    Pain Frequency Intermittent    Aggravating Factors  bending knee    Pain Relieving Factors straightening knee    Effect of Pain on Daily Activities gardening    Multiple Pain Sites Yes    Pain Score 7    Pain Location Elbow    Pain  Orientation Right    Pain Descriptors / Indicators Sharp    Pain Type Acute pain    Pain Onset More than a month ago    Pain Frequency Constant    Aggravating Factors  bumping it              [facility] PT Assessment - 10/12/19 0001      Assessment   Medical Diagnosis pain in left calf; Rt elbow lateral epicondylitis    Referring Provider (PT) Clearance Coots    Onset Date/Surgical Date 08/19/19   calf started 4-5 yrs ago   Hand Dominance Left    Next MD Visit 11/12/19    Prior Therapy back (years ago)      Precautions   Precautions None      Restrictions   Weight Bearing Restrictions No      Balance Screen   Has the patient fallen in the past 6 months No    Has the patient had a decrease in activity level because of a fear of falling?  No    Is the patient reluctant to leave their home because of a fear of falling?  No      Home Environment  Living Environment Private residence    Additional Comments pain with stairs up and down      Prior Function   Level of Independence Independent    Vocation Part time employment    Vocation Requirements companionship    Leisure gardening, walking, biking      Posture/Postural Control   Posture Comments bil pes planus      ROM / Strength   AROM / PROM / Strength AROM;Strength      AROM   Overall AROM Comments left ankle DF 2 deg/PROM 10 deg; left knee 132 deg ; wrist flexion63 deg/90 passive      Strength   Overall Strength Comments BLE grossly 5/5 except left hip ABD 4/5; right wrist ext 5/5, biceps 5/5, pro/sup 5/5 no pain      Flexibility   Soft Tissue Assessment /Muscle Length yes    Hamstrings WNL    Quadriceps WNL    ITB left tight    Piriformis bil tightness      Palpation   Patella mobility WNL    Palpation comment left ITB, distal HS, popliteus, lateral gastroc, soleus; right wrist extensors and lateral epicondyle      Special Tests   Other special tests neg for meniscus and Speed's                       Objective measurements completed on examination: See above findings.       [facility] Adult PT Treatment/Exercise - 10/12/19 0001      Modalities   Modalities Iontophoresis      Iontophoresis   Type of Iontophoresis Dexamethasone    Location right lat epicondyle    Dose 1.0 ml    Time 4 hour patch      Manual Therapy   Manual Therapy Soft tissue mobilization    Manual therapy comments skilled palpation and monitoring of soft tissues during DN    Soft tissue mobilization to right wrist extensors, and  left lateral gastroc            Trigger Point Dry Needling - 10/12/19 0001    Consent Given? Yes    Education Handout Provided Yes    Muscles Treated Wrist/Hand Extensor carpi radialis longus/brevis;Extensor digitorum    Muscles Treated Lower Quadrant Popliteus;Gastrocnemius    Dry Needling Comments right arm/left leg    Extensor carpi radialis longus/brevis Response Twitch response elicited;Palpable increased muscle length    Extensor digitorum Response Twitch response elicited;Palpable increased muscle length    Popliteus Response Palpable increased muscle length    Gastrocnemius Response Twitch response elicited;Palpable increased muscle length   lateral               PT Education - 10/12/19 1204    Education Details HEP/ DN AND IONTO ED    Person(s) Educated Patient    Methods Explanation;Demonstration;Handout    Comprehension Verbalized understanding;Returned demonstration               PT Long Term Goals - 10/12/19 1220      PT LONG TERM GOAL #1   Title Patient able to climb stairs without pain in the left knee.    Time 6    Period Weeks    Status New    Target Date 11/23/19      PT LONG TERM GOAL #2   Title Patient able to sleep without waking from knee or elbow pain.    Time 6    Period  Weeks    Status New      PT LONG TERM GOAL #3   Title Patient independent with HEP to prevent further injury.    Time 6    Period  Weeks    Status New      PT LONG TERM GOAL #4   Title Improved FOTO score to <= 19% limitations    Baseline 27% limitations    Time 6    Period Weeks    Status New      PT LONG TERM GOAL #5   Title Patient able to perform ADLS with >= 22% improvement in pain.    Time 6    Period Weeks    Status New                  Plan - 10/12/19 1205    Clinical Impression Statement Patient presents today with c/o of left post knee pain and right lateral elbow pain. Knee has been bothering her for 4-5 years worsening in the past week when it gave way. She also has pain with stairs and knee flexion. She has tenderness and increased tone in post knee and lateral gastroc and some in her HS. Range of motion and strength is good (some ABD weakness), but painful at end range knee flexion. She has bil tightness in her calves and hip rotators as well as her left ITB. Her right elbow is tender to the touch at the lateral epicondyle and the extensor muscle bellies are tight and tender. She has not been using her arm much and it wakes her from sleeping when she turns on it. She has functional ROM and good strength here as well. She responded very well to DN  and manual therapy the forearm and calf. Ionto was applied to her elbow. Patient will benefit from PT to decrease patient's pain and return her to her PLOF.    Stability/Clinical Decision Making Stable/Uncomplicated    Clinical Decision Making Low    Rehab Potential Excellent    PT Frequency 1x / week   due to high deductable   PT Duration 6 weeks    PT Treatment/Interventions ADLs/Self Care Home Management;Iontophoresis 4mg /ml Dexamethasone;Electrical Stimulation;Cryotherapy;Moist Heat;Therapeutic exercise;Therapeutic activities;Patient/family education;Manual techniques;Dry needling;Taping    PT Next Visit Plan Assess DN and ionto. Reveiw HEP. Add eccentric strengthening for elbow and gastrocs as tol.    PT Home Exercise Plan [email or web address]    Consulted  and Agree with Plan of Care Patient           Patient will benefit from skilled therapeutic intervention in order to improve the following deficits and impairments:  Pain, Decreased strength, Impaired flexibility, Impaired UE functional use  Visit Diagnosis: Chronic pain of left knee - Plan: PT plan of care cert/re-cert  Pain in right elbow - Plan: PT plan of care cert/re-cert     Problem List Patient Active Problem List   Diagnosis Date Noted  . Pain of left calf 10/07/2019  . Lateral epicondylitis of right elbow 10/07/2019  . Hepatic lesion 02/15/2019  . Uterine fibroid 12/03/2018  . Abdominal fullness 12/02/2018  . Dark urine 12/02/2018  . Left nephrolithiasis 10/17/2017  . Postmenopausal 09/25/2017  . History of kidney stones 09/25/2017  . Melanoma in situ of trunk ([place]) 09/25/2017  . Hemorrhoids 09/25/2017    Madelyn Flavors PT 10/12/2019, 12:30 PM  [facility] [address] [place], [place], [address] Phone: [phone]   Fax:  [phone]  Name: Teresa Morton MRN: [id number] Date of Birth: 1956/02/14

## 2019-10-13 ENCOUNTER — Ambulatory Visit: Payer: 59 | Admitting: Sports Medicine

## 2019-10-26 ENCOUNTER — Encounter: Payer: Self-pay | Admitting: Physical Therapy

## 2019-10-26 ENCOUNTER — Ambulatory Visit (INDEPENDENT_AMBULATORY_CARE_PROVIDER_SITE_OTHER): Payer: 59 | Admitting: Physical Therapy

## 2019-10-26 ENCOUNTER — Other Ambulatory Visit: Payer: Self-pay

## 2019-10-26 DIAGNOSIS — G8929 Other chronic pain: Secondary | ICD-10-CM | POA: Diagnosis not present

## 2019-10-26 DIAGNOSIS — M25562 Pain in left knee: Secondary | ICD-10-CM | POA: Diagnosis not present

## 2019-10-26 DIAGNOSIS — M25521 Pain in right elbow: Secondary | ICD-10-CM | POA: Diagnosis not present

## 2019-10-26 NOTE — Therapy (Addendum)
[place], [place], [address] Phone: [phone]   Fax:  [phone]  Physical Therapy Treatment and Discharge Summary  Patient Details  Name: Teresa Morton MRN: [id number] Date of Birth: 10-04-1955 Referring Provider (PT): Clearance Coots   Encounter Date: 10/26/2019   PT End of Session - 10/26/19 1151    Visit Number 2    Date for PT Re-Evaluation 11/23/19    Authorization Type U[employer]    PT Start Time 1151    PT Stop Time 1232    PT Time Calculation (min) 41 min    Activity Tolerance Patient tolerated treatment well    Behavior During Therapy [facility] for tasks assessed/performed           Past Medical History:  Diagnosis Date  . GERD (gastroesophageal reflux disease)   . Melanoma in situ of trunk [facility])     Past Surgical History:  Procedure Laterality Date  . CHOLECYSTECTOMY, LAPAROSCOPIC  2005  . SALPINGOOPHORECTOMY  2007    There were no vitals filed for this visit.   Subjective Assessment - 10/26/19 1151    Subjective Elbow is 98% better. Knee pain was better until last Friday and now pain has moved to medial left knee and pain is constant. Feels numbness in medial knee and goes down ant shin.    Currently in Pain? Yes    Pain Score 9     Pain Location Knee    Pain Orientation Left    Pain Descriptors / Indicators Throbbing    Pain Type Chronic pain    Pain Score 0    Pain Location Elbow    Pain Orientation Right                             [facility] Adult PT Treatment/Exercise - 10/26/19 0001      Exercises   Exercises Knee/Hip      Knee/Hip Exercises: Stretches   Active Hamstring Stretch Left;1 rep;60 seconds    Gastroc Stretch Left;1 rep;30 seconds    Soleus Stretch 1 rep;Left;30 seconds    Other Knee/Hip Stretches left hip ADDuctors stretch in standing and with strap x 20 sec each      Knee/Hip Exercises: Supine   Quad Sets Left;5 reps    Short Arc  Quad Sets Left;10 reps    Short Arc Quad Sets Limitations 3 sec hold      Modalities   Modalities Iontophoresis      Iontophoresis   Type of Iontophoresis Dexamethasone    Location left medial knee    Dose 1.0 ml    Time 4 hour patch      Manual Therapy   Manual Therapy Soft tissue mobilization    Manual therapy comments skilled palpation and monitoring of soft tissues during DN; post knee/gastrocs and HS assessesed as well    Soft tissue mobilization to left quads and hip ADDuctors;                   PT Education - 10/26/19 1249    Education Details HEP progressed    Person(s) Educated Patient    Methods Explanation;Demonstration;Handout    Comprehension Returned demonstration;Verbalized understanding               PT Long Term Goals - 10/26/19 1225      PT LONG TERM GOAL #1   Title Patient able to climb stairs without pain  in the left knee.    Status On-going      PT LONG TERM GOAL #2   Title Patient able to sleep without waking from knee or elbow pain.    Baseline no pain with elbow    Status Partially Met      PT LONG TERM GOAL #3   Title Patient independent with HEP to prevent further injury.    Status Partially Met                 Plan - 10/26/19 1250    Clinical Impression Statement Patient presents with reports of elbow being 98% better and left post knee pain being resolved. However, last Friday she had sharp pain in her medial knee and this has remained constant affecting sleep and stairs. She also reports some feelings of numbness in her left shin. She has tenderness at medial knee and had positive pain with knee valgus testing. Neg Apley compression. She responded well to DN/manual therapy to medial quads and hip ADDuctors which were tight. Ionto applied to left medial knee.    PT Frequency 1x / week    PT Duration 6 weeks    PT Treatment/Interventions ADLs/Self Care Home Management;Iontophoresis 59m/ml Dexamethasone;Electrical  Stimulation;Cryotherapy;Moist Heat;Therapeutic exercise;Therapeutic activities;Patient/family education;Manual techniques;Dry needling;Taping    PT Next Visit Plan Assess DN and ionto. Reveiw HEP. Add eccentric strengthening for elbow and gastrocs as tol.    PT Home Exercise Plan [id number]    Consulted and Agree with Plan of Care Patient           Patient will benefit from skilled therapeutic intervention in order to improve the following deficits and impairments:  Pain, Decreased strength, Impaired flexibility, Impaired UE functional use  Visit Diagnosis: Chronic pain of left knee  Pain in right elbow     Problem List Patient Active Problem List   Diagnosis Date Noted  . Pain of left calf 10/07/2019  . Lateral epicondylitis of right elbow 10/07/2019  . Hepatic lesion 02/15/2019  . Uterine fibroid 12/03/2018  . Abdominal fullness 12/02/2018  . Dark urine 12/02/2018  . Left nephrolithiasis 10/17/2017  . Postmenopausal 09/25/2017  . History of kidney stones 09/25/2017  . Melanoma in situ of trunk ([place] 09/25/2017  . Hemorrhoids 09/25/2017   JMadelyn FlavorsPT 10/26/2019, 12:59 PM  [facility] [place] [address]Phone: [phone]  Fax:  [phone] Name: Teresa WenzMRN: [id number] of Birth: [birth year] PHYSICAL THERAPY DISCHARGE SUMMARY  Visits from Start of Care: 2  Current functional level related to goals / functional outcomes: unknown   Remaining deficits: unknown   Education / Equipment: HEP Plan: Patient agrees to discharge.  Patient goals were not met. Patient is being discharged due to not returning since the last visit.  ?????     JMadelyn Flavors PT 01/20/20 12:43 PM  [facility] [place] [address] [phone](office) [phone](fax)

## 2019-10-26 NOTE — Patient Instructions (Signed)
Access Code: [email or web address] URL: [email or web address] Date: 10/26/2019 Prepared by: Almyra Free  Exercises Doorway Piriformis Stretch - 2 x daily - 7 x weekly - 1 sets - 3 reps - 30-60 sec hold Supine ITB Stretch with Strap - 2 x daily - 7 x weekly - 1 sets - 3 reps - 30-6060 sec hold Seated Wrist Flexion Stretch - 1 x daily - 7 x weekly - 1 sets - 3 reps - 30-60 sec hold Supine Hamstring Stretch with Strap - 2 x daily - 7 x weekly - 3 reps - 1 sets - 60 sec hold Hip Adductors and Hamstring Stretch with Strap - 2 x daily - 7 x weekly - 3 reps - 1 sets - 60 seconds hold Supine Knee Extension Strengthening - 1 x daily - 3 x weekly - 2 sets - 10 reps - 5 sec hold  Patient Education Trigger Point Dry Needling Ionto Patient Instructions

## 2019-11-11 ENCOUNTER — Ambulatory Visit: Payer: 59 | Admitting: Family Medicine

## 2019-12-13 ENCOUNTER — Other Ambulatory Visit: Payer: Self-pay | Admitting: Advanced Practice Midwife

## 2019-12-13 DIAGNOSIS — Z1231 Encounter for screening mammogram for malignant neoplasm of breast: Secondary | ICD-10-CM

## 2020-01-31 ENCOUNTER — Other Ambulatory Visit: Payer: Self-pay

## 2020-01-31 ENCOUNTER — Ambulatory Visit (INDEPENDENT_AMBULATORY_CARE_PROVIDER_SITE_OTHER): Payer: 59

## 2020-01-31 ENCOUNTER — Ambulatory Visit (INDEPENDENT_AMBULATORY_CARE_PROVIDER_SITE_OTHER): Payer: 59 | Admitting: Sports Medicine

## 2020-01-31 DIAGNOSIS — M25562 Pain in left knee: Secondary | ICD-10-CM

## 2020-01-31 DIAGNOSIS — G8929 Other chronic pain: Secondary | ICD-10-CM

## 2020-01-31 DIAGNOSIS — R2242 Localized swelling, mass and lump, left lower limb: Secondary | ICD-10-CM | POA: Diagnosis not present

## 2020-01-31 DIAGNOSIS — M25561 Pain in right knee: Secondary | ICD-10-CM | POA: Diagnosis not present

## 2020-01-31 DIAGNOSIS — M25462 Effusion, left knee: Secondary | ICD-10-CM

## 2020-01-31 NOTE — Progress Notes (Signed)
    Procedures performed today:    Procedure: Real-time Ultrasound Guided  aspiration/injection of left knee Device: Samsung HS60  Verbal informed consent obtained.  Time-out conducted.  Noted no overlying erythema, induration, or other signs of local infection.  Skin prepped in a sterile fashion.  Local anesthesia: Topical Ethyl chloride.  With sterile technique and under real time ultrasound guidance:  Using 18-gauge needle aspirated approximately 30 mL of clear, straw-colored fluid, syringe switched and 1 cc Kenalog 40, 2 cc lidocaine, 2 cc bupivacaine injected easily Completed without difficulty  Advised to call if fevers/chills, erythema, induration, drainage, or persistent bleeding.  Images permanently stored and available for review in PACS.  Impression: Technically successful ultrasound guided injection.  Independent interpretation of notes and tests performed by another provider:   None.  Brief History, Exam, Impression, and Recommendations:    Left knee pain Years of left knee pain, difficulty with deep knee bending. Effusion on exam with medial joint line pain, she likely has osteoarthritis with a meniscal tear. Aspiration and injection, x-rays, home rehab exercises given per Return to see me in 4 weeks, MRI for surgical planning if no better.    ___________________________________________ Gwen Her. Dianah Field, M.D., ABFM., CAQSM. Primary Care and [place] Instructor of [place] of [facility] of Medicine

## 2020-01-31 NOTE — Assessment & Plan Note (Signed)
Years of left knee pain, difficulty with deep knee bending. Effusion on exam with medial joint line pain, she likely has osteoarthritis with a meniscal tear. Aspiration and injection, x-rays, home rehab exercises given per Return to see me in 4 weeks, MRI for surgical planning if no better.

## 2020-02-09 ENCOUNTER — Other Ambulatory Visit: Payer: Self-pay

## 2020-02-09 ENCOUNTER — Ambulatory Visit: Payer: 59

## 2020-02-29 ENCOUNTER — Other Ambulatory Visit: Payer: Self-pay

## 2020-02-29 ENCOUNTER — Ambulatory Visit (INDEPENDENT_AMBULATORY_CARE_PROVIDER_SITE_OTHER): Payer: 59 | Admitting: Sports Medicine

## 2020-02-29 DIAGNOSIS — G8929 Other chronic pain: Secondary | ICD-10-CM | POA: Diagnosis not present

## 2020-02-29 DIAGNOSIS — M25562 Pain in left knee: Secondary | ICD-10-CM

## 2020-02-29 NOTE — Assessment & Plan Note (Signed)
Teresa Morton is a pleasant 65 year old female with many years of knee pain, difficulty with bending, we did an aspiration and injection at the last visit, we suspected osteoarthritis and meniscal tearing, she returns today pain-free, happy with how things are going, she will be aware of the knee, get more diligent with her rehab exercises, and return to see me on an as-needed basis, neck step if this flares again would be an MRI.

## 2020-02-29 NOTE — Progress Notes (Signed)
    Procedures performed today:    None.  Independent interpretation of notes and tests performed by another provider:   None.  Brief History, Exam, Impression, and Recommendations:    Left knee pain Teresa Morton is a pleasant 65 year old female with many years of knee pain, difficulty with bending, we did an aspiration and injection at the last visit, we suspected osteoarthritis and meniscal tearing, she returns today pain-free, happy with how things are going, she will be aware of the knee, get more diligent with her rehab exercises, and return to see me on an as-needed basis, neck step if this flares again would be an MRI.    ___________________________________________ Gwen Her. Dianah Field, M.D., ABFM., CAQSM. Primary Care and [place] Instructor of [place] of [facility] of [facility]

## 2020-03-09 ENCOUNTER — Ambulatory Visit (INDEPENDENT_AMBULATORY_CARE_PROVIDER_SITE_OTHER): Payer: 59

## 2020-03-09 ENCOUNTER — Other Ambulatory Visit: Payer: Self-pay

## 2020-03-09 DIAGNOSIS — Z1231 Encounter for screening mammogram for malignant neoplasm of breast: Secondary | ICD-10-CM

## 2020-04-18 HISTORY — PX: KNEE ARTHROSCOPY: SUR90

## 2020-04-19 ENCOUNTER — Telehealth: Payer: Self-pay

## 2020-04-19 DIAGNOSIS — G8929 Other chronic pain: Secondary | ICD-10-CM

## 2020-04-19 DIAGNOSIS — M25562 Pain in left knee: Secondary | ICD-10-CM

## 2020-04-19 NOTE — Telephone Encounter (Signed)
Patient saw you on 02/29/2020. She called today to report that the knee is pretty much unchanged. She stated that you mentioned if no better, then we would proceed with an MRI. She would like to have this ordered.

## 2020-04-19 NOTE — Telephone Encounter (Signed)
No problem, MRI ordered.  To Amber: Patient has failed aspiration, injection, analgesics/NSAIDs, question meniscal tear, pain at the joint lines with mechanical symptoms and a positive McMurray sign, surgical planning.

## 2020-04-19 NOTE — Telephone Encounter (Signed)
No auth needed.

## 2020-04-20 NOTE — Telephone Encounter (Signed)
Patient scheduled for this Saturday for her MRI.

## 2020-04-22 ENCOUNTER — Ambulatory Visit (INDEPENDENT_AMBULATORY_CARE_PROVIDER_SITE_OTHER): Payer: 59

## 2020-04-22 ENCOUNTER — Other Ambulatory Visit: Payer: Self-pay

## 2020-04-22 DIAGNOSIS — G8929 Other chronic pain: Secondary | ICD-10-CM

## 2020-04-22 DIAGNOSIS — M25562 Pain in left knee: Secondary | ICD-10-CM | POA: Diagnosis not present

## 2020-04-24 ENCOUNTER — Other Ambulatory Visit: Payer: Self-pay | Admitting: Sports Medicine

## 2020-04-24 DIAGNOSIS — G8929 Other chronic pain: Secondary | ICD-10-CM

## 2020-08-02 ENCOUNTER — Telehealth: Payer: Self-pay | Admitting: Family Medicine

## 2020-08-02 NOTE — Telephone Encounter (Signed)
Pt, called she would like to know if the nurse has any suggestion about over the counter medicine she can take for her sinus she does not want to come to the office she was not been tested for Covid  Please advice

## 2020-08-02 NOTE — Telephone Encounter (Signed)
Patient informed. 

## 2020-08-02 NOTE — Telephone Encounter (Signed)
Symptoms last Monday body aches, stuffy nose and headaches. She is taking OTC Sinus meds. Symptoms lasting one week and she is till stuff up. Has not done a test for Covid. As of today no more body aches and no fever Mostly just stuffiness/

## 2020-09-08 ENCOUNTER — Other Ambulatory Visit: Payer: Self-pay

## 2020-09-08 ENCOUNTER — Ambulatory Visit (INDEPENDENT_AMBULATORY_CARE_PROVIDER_SITE_OTHER): Payer: 59

## 2020-09-08 ENCOUNTER — Ambulatory Visit: Payer: 59 | Admitting: Sports Medicine

## 2020-09-08 DIAGNOSIS — M170 Bilateral primary osteoarthritis of knee: Secondary | ICD-10-CM

## 2020-09-08 NOTE — Progress Notes (Signed)
    Procedures performed today:    Procedure: Real-time Ultrasound Guided injection of the right knee Device: Samsung HS60  Verbal informed consent obtained.  Time-out conducted.  Noted no overlying erythema, induration, or other signs of local infection.  Skin prepped in a sterile fashion.  Local anesthesia: Topical Ethyl chloride.  With sterile technique and under real time ultrasound guidance: Noted trace effusion, 1 cc Kenalog 40, 2 cc lidocaine, 2 cc bupivacaine injected easily Completed without difficulty  Advised to call if fevers/chills, erythema, induration, drainage, or persistent bleeding.  Images permanently stored and available for review in PACS.  Impression: Technically successful ultrasound guided injection.  Independent interpretation of notes and tests performed by another provider:   None.  Brief History, Exam, Impression, and Recommendations:    Primary osteoarthritis of both knees Teresa Morton returns, she is a very pleasant 65 year old female with a long history of bilateral knee pain, approximately 6 to 7 months ago we ended up doing a left knee aspiration and injection and she continues to be pain-free. Unfortunately her right knee is starting to hurt, posterior/medial joint line, no mechanical symptoms, on exam she has minimal swelling and pain at the medial joint line. Previous x-rays have shown osteoarthritis, she has tried oral NSAIDs and analgesics without sufficient improvement, she is having significant nighttime pain, I injected her right knee today, return to see me in 1 month as needed.  Chronic process with exacerbation and pharmacologic intervention.    ___________________________________________ Gwen Her. Dianah Field, M.D., ABFM., CAQSM. Primary Care and [place] Instructor of [place] of [facility] of Medicine

## 2020-09-08 NOTE — Assessment & Plan Note (Signed)
Teresa Morton returns, she is a very pleasant 65 year old female with a long history of bilateral knee pain, approximately 6 to 7 months ago we ended up doing a left knee aspiration and injection and she continues to be pain-free. Unfortunately her right knee is starting to hurt, posterior/medial joint line, no mechanical symptoms, on exam she has minimal swelling and pain at the medial joint line. Previous x-rays have shown osteoarthritis, she has tried oral NSAIDs and analgesics without sufficient improvement, she is having significant nighttime pain, I injected her right knee today, return to see me in 1 month as needed.  Chronic process with exacerbation and pharmacologic intervention.

## 2020-09-19 ENCOUNTER — Ambulatory Visit (INDEPENDENT_AMBULATORY_CARE_PROVIDER_SITE_OTHER): Payer: 59 | Admitting: Sports Medicine

## 2020-09-19 ENCOUNTER — Ambulatory Visit (INDEPENDENT_AMBULATORY_CARE_PROVIDER_SITE_OTHER): Payer: 59

## 2020-09-19 ENCOUNTER — Other Ambulatory Visit: Payer: Self-pay

## 2020-09-19 DIAGNOSIS — M170 Bilateral primary osteoarthritis of knee: Secondary | ICD-10-CM

## 2020-09-19 MED ORDER — TRAMADOL HCL 50 MG PO TABS: 50.0000 mg | ORAL_TABLET | Freq: Three times a day (TID) | ORAL | 0 refills | Status: AC | PRN

## 2020-09-19 NOTE — Assessment & Plan Note (Addendum)
Teresa Morton returns, she is a pleasant 65 year old female, she has a history of bilateral knee pain, approximately 7 months ago we did a left knee aspiration and injection and she continues to do well, at the last visit her right knee was starting to hurt, we did an injection, she returns 10 days later, increasing swelling, persistent pain. Medial joint line pain, locking, proceeding with MRI of the right knee, therapeutic arthrocentesis today, refilling tramadol. Treatment will depend on MRI results, if simple osteoarthritis we will do viscosupplementation, if significant meniscal tearing we will refer for arthroscopy. Fluid was serosanguineous and cloudy, adding cultures, crystal analysis, cell counts.

## 2020-09-19 NOTE — Progress Notes (Signed)
    Procedures performed today:    Procedure: Real-time Ultrasound Guided aspiration of the right knee Device: Samsung HS60  Verbal informed consent obtained.  Time-out conducted.  Noted no overlying erythema, induration, or other signs of local infection.  Skin prepped in a sterile fashion.  Local anesthesia: Topical Ethyl chloride.  With sterile technique and under real time ultrasound guidance: I advanced an 18-gauge needle into the suprapatellar recess, aspirated 15 mL of serosanguineous cloudy fluid. Completed without difficulty  Advised to call if fevers/chills, erythema, induration, drainage, or persistent bleeding.  Images permanently stored and available for review in PACS.  Impression: Technically successful ultrasound guided arthrocentesis.  Independent interpretation of notes and tests performed by another provider:   None.  Brief History, Exam, Impression, and Recommendations:    Primary osteoarthritis of both knees Teresa Morton returns, she is a pleasant 65 year old female, she has a history of bilateral knee pain, approximately 7 months ago we did a left knee aspiration and injection and she continues to do well, at the last visit her right knee was starting to hurt, we did an injection, she returns 10 days later, increasing swelling, persistent pain. Medial joint line pain, locking, proceeding with MRI of the right knee, therapeutic arthrocentesis today, refilling tramadol. Treatment will depend on MRI results, if simple osteoarthritis we will do viscosupplementation, if significant meniscal tearing we will refer for arthroscopy. Fluid was serosanguineous and cloudy, adding cultures, crystal analysis, cell counts.    ___________________________________________ Gwen Her. Dianah Field, M.D., ABFM., CAQSM. Primary Care and [place] Instructor of [place] of [facility] of Medicine

## 2020-09-20 LAB — CELL COUNT AND DIFF, FLUID, OTHER
Basophils, %: 0 %
Eosinophils, %: 0 %
Lymphocytes, %: 76 %
Mesothelial, %: 0 %
Monocyte/Macrophage %: 8 %
Neutrophils, %: 16 %
Total Nucleated Cell Ct: 354 cells/uL

## 2020-09-20 LAB — SYNOVIAL FLUID, CRYSTAL

## 2020-09-24 ENCOUNTER — Ambulatory Visit (INDEPENDENT_AMBULATORY_CARE_PROVIDER_SITE_OTHER): Payer: 59

## 2020-09-24 ENCOUNTER — Other Ambulatory Visit: Payer: Self-pay

## 2020-09-24 DIAGNOSIS — M170 Bilateral primary osteoarthritis of knee: Secondary | ICD-10-CM

## 2020-09-27 ENCOUNTER — Telehealth: Payer: Self-pay

## 2020-09-27 DIAGNOSIS — M170 Bilateral primary osteoarthritis of knee: Secondary | ICD-10-CM

## 2020-09-27 NOTE — Telephone Encounter (Signed)
Patient aware Varkey's office will call her to schedule.

## 2020-09-27 NOTE — Telephone Encounter (Signed)
Patient called to report that she had her left knee done by [facility] and would like to see him again for the right knee.

## 2020-09-27 NOTE — Telephone Encounter (Signed)
Referral placed.

## 2020-10-06 ENCOUNTER — Other Ambulatory Visit: Payer: Self-pay

## 2020-10-06 ENCOUNTER — Ambulatory Visit: Payer: 59 | Admitting: Sports Medicine

## 2020-10-06 ENCOUNTER — Encounter (HOSPITAL_BASED_OUTPATIENT_CLINIC_OR_DEPARTMENT_OTHER): Payer: Self-pay | Admitting: Orthopaedic Surgery

## 2020-10-11 NOTE — H&P (Signed)
PREOPERATIVE H&P  Chief Complaint: right knee meniscus tear  HPI: Teresa Morton is a 65 y.o. female who is scheduled for, Procedure(s): KNEE ARTHROSCOPY WITH MEDIAL MENISECTOMY.   Patient is a healthy 65 year old female who Dr.Varkey did a left medial meniscal root debridement and meniscectomy in March of 2022.  She did great with that.  She unfortunately had similar symptoms on her right knee.  She had an MRI done by Dr. Dianah Field and she was sent to Dr. Griffin Basil for evaluation of the right knee.  She continues to have pain and mechanical symptoms with this knee.  She is interested in proceeding with similar procedure as with her left knee.    Her symptoms are rated as moderate to severe, and have been worsening.  This is significantly impairing activities of daily living.    Please see clinic note for further details on this patient's care.    She has elected for surgical management.   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    Employer: [employer]  Tobacco Use   Smoking status: Former    Types: Cigarettes    Quit date: 1984    Years since quitting: 38.6   Smokeless tobacco: Never  Vaping Use   Vaping Use: Never used  Substance and Sexual Activity   Alcohol use: Never   Drug use: Never   Sexual activity: Yes    Partners: Male    Birth control/protection: Post-menopausal  Other Topics Concern   Not on file  Social History Narrative    Not on file   Social Determinants of Health   Financial Resource Strain: Not on file  Food Insecurity: Not on file  Transportation Needs: Not on file  Physical Activity: Not on file  Stress: Not on file  Social Connections: Not on file   Family History  Problem Relation Age of Onset   Liver cancer Mother    Alcoholism Father    Colon cancer Neg Hx    Esophageal cancer Neg Hx    Rectal cancer Neg Hx    Stomach cancer Neg Hx    Allergies  Allergen Reactions   Codeine Nausea And Vomiting    Vomiting     Prior to Admission medications   Medication Sig Start Date End Date Taking? Authorizing Provider  Biotin 1000 MCG tablet Take 1,000 mcg by mouth 3 (three) times daily.   Yes [provider]  traMADol (ULTRAM) 50 MG tablet Take 1-2 tablets (50-100 mg total) by mouth every 8 (eight) hours as needed for moderate pain. Maximum 6 tabs per day. 09/19/20  Yes Silverio Decamp, MD  Turmeric 500 MG CAPS Take by mouth.   Yes [provider]    ROS: All other systems have been reviewed and were otherwise negative with the exception of those mentioned in  the HPI and as above.  Physical Exam: General: Alert, no acute distress Cardiovascular: No pedal edema Respiratory: No cyanosis, no use of accessory musculature GI: No organomegaly, abdomen is soft and non-tender Skin: No lesions in the area of chief complaint Neurologic: Sensation intact distally Psychiatric: Patient is competent for consent with normal mood and affect Lymphatic: No axillary or cervical lymphadenopathy  MUSCULOSKELETAL:  Right knee: Exam of the right knee reveals range of motion is full.  Tender at the medial joint line.  Positive McMurray's.    Imaging: MRI demonstrates a complex posteromedial meniscal root tear.  She has tri-compartmental arthritis changes and possible MCL sprain as well.    Assessment: right knee meniscus tear  Plan: Plan for Procedure(s): KNEE ARTHROSCOPY WITH  MEDIAL MENISECTOMY  The risks benefits and alternatives were discussed with the patient including but not limited to the risks of nonoperative treatment, versus surgical intervention including infection, bleeding, nerve injury,  blood clots, cardiopulmonary complications, morbidity, mortality, among others, and they were willing to proceed.   The patient acknowledged the explanation, agreed to proceed with the plan and consent was signed.   Operative Plan: Right knee scope with partial medial meniscectomy  Discharge Medications: Tylenol, Tramadol, Celebrex, Promethazine '25mg'$  DVT Prophylaxis: Aspirin Physical Therapy: +/- Special Discharge needs: +/-   Ethelda Chick, PA-C  10/11/2020 2:26 PM

## 2020-10-12 ENCOUNTER — Ambulatory Visit (HOSPITAL_BASED_OUTPATIENT_CLINIC_OR_DEPARTMENT_OTHER): Payer: 59 | Admitting: Certified Registered"

## 2020-10-12 ENCOUNTER — Ambulatory Visit (HOSPITAL_BASED_OUTPATIENT_CLINIC_OR_DEPARTMENT_OTHER)
Admission: RE | Admit: 2020-10-12 | Discharge: 2020-10-12 | Disposition: A | Discharge status: Home / Self Care | Payer: 59 | Attending: Orthopaedic Surgery | Admitting: Orthopaedic Surgery

## 2020-10-12 ENCOUNTER — Encounter (HOSPITAL_BASED_OUTPATIENT_CLINIC_OR_DEPARTMENT_OTHER): Payer: Self-pay | Admitting: Orthopaedic Surgery

## 2020-10-12 ENCOUNTER — Other Ambulatory Visit: Payer: Self-pay

## 2020-10-12 ENCOUNTER — Encounter (HOSPITAL_BASED_OUTPATIENT_CLINIC_OR_DEPARTMENT_OTHER)
Admission: RE | Disposition: A | Discharge status: Home / Self Care | Payer: Self-pay | Source: Home / Self Care | Attending: Orthopaedic Surgery

## 2020-10-12 DIAGNOSIS — Z87891 Personal history of nicotine dependence: Secondary | ICD-10-CM | POA: Insufficient documentation

## 2020-10-12 DIAGNOSIS — X58XXXA Exposure to other specified factors, initial encounter: Secondary | ICD-10-CM | POA: Insufficient documentation

## 2020-10-12 DIAGNOSIS — S83231A Complex tear of medial meniscus, current injury, right knee, initial encounter: Secondary | ICD-10-CM | POA: Insufficient documentation

## 2020-10-12 DIAGNOSIS — M170 Bilateral primary osteoarthritis of knee: Secondary | ICD-10-CM | POA: Insufficient documentation

## 2020-10-12 DIAGNOSIS — S83241A Other tear of medial meniscus, current injury, right knee, initial encounter: Secondary | ICD-10-CM | POA: Diagnosis present

## 2020-10-12 HISTORY — DX: Unspecified osteoarthritis, unspecified site: M19.90

## 2020-10-12 HISTORY — PX: KNEE ARTHROSCOPY WITH MEDIAL MENISECTOMY: SHX5651

## 2020-10-12 SURGERY — ARTHROSCOPY, KNEE, WITH MEDIAL MENISCECTOMY
Anesthesia: General | Site: Knee | Laterality: Right

## 2020-10-12 MED ORDER — KETOROLAC TROMETHAMINE 30 MG/ML IJ SOLN: 30.0000 mg | Freq: Once | INTRAMUSCULAR | Status: DC | PRN

## 2020-10-12 MED ORDER — CEFAZOLIN SODIUM-DEXTROSE 2-4 GM/100ML-% IV SOLN
INTRAVENOUS | Status: AC
  Filled 2020-10-12: qty 100

## 2020-10-12 MED ORDER — ONDANSETRON HCL 4 MG/2ML IJ SOLN
INTRAMUSCULAR | Status: DC | PRN
  Administered 2020-10-12: 4 mg via INTRAVENOUS

## 2020-10-12 MED ORDER — BUPIVACAINE HCL (PF) 0.25 % IJ SOLN
INTRAMUSCULAR | Status: DC | PRN
  Administered 2020-10-12: 20 mL via INTRA_ARTICULAR

## 2020-10-12 MED ORDER — SUGAMMADEX SODIUM 500 MG/5ML IV SOLN
INTRAVENOUS | Status: AC
  Filled 2020-10-12: qty 10

## 2020-10-12 MED ORDER — MIDAZOLAM HCL 2 MG/2ML IJ SOLN
INTRAMUSCULAR | Status: DC | PRN
  Administered 2020-10-12: 2 mg via INTRAVENOUS

## 2020-10-12 MED ORDER — SODIUM CHLORIDE 0.9 % IR SOLN
Status: DC | PRN
  Administered 2020-10-12: 3000 mL

## 2020-10-12 MED ORDER — CEFAZOLIN SODIUM-DEXTROSE 2-4 GM/100ML-% IV SOLN: 2.0000 g | INTRAVENOUS | Status: DC

## 2020-10-12 MED ORDER — PROPOFOL 10 MG/ML IV BOLUS
INTRAVENOUS | Status: DC | PRN
  Administered 2020-10-12: 180 mg via INTRAVENOUS

## 2020-10-12 MED ORDER — LACTATED RINGERS IV SOLN: INTRAVENOUS | Status: DC

## 2020-10-12 MED ORDER — ONDANSETRON HCL 4 MG/2ML IJ SOLN: 4.0000 mg | Freq: Once | INTRAMUSCULAR | Status: DC | PRN

## 2020-10-12 MED ORDER — OXYCODONE HCL 5 MG/5ML PO SOLN: 5.0000 mg | Freq: Once | ORAL | Status: DC | PRN

## 2020-10-12 MED ORDER — LACTATED RINGERS IV SOLN: INTRAVENOUS | Status: DC | PRN

## 2020-10-12 MED ORDER — ACETAMINOPHEN 160 MG/5ML PO SOLN: 325.0000 mg | ORAL | Status: DC | PRN

## 2020-10-12 MED ORDER — DEXAMETHASONE SODIUM PHOSPHATE 10 MG/ML IJ SOLN
INTRAMUSCULAR | Status: DC | PRN
  Administered 2020-10-12: 10 mg via INTRAVENOUS

## 2020-10-12 MED ORDER — MIDAZOLAM HCL 2 MG/2ML IJ SOLN
INTRAMUSCULAR | Status: AC
  Filled 2020-10-12: qty 2

## 2020-10-12 MED ORDER — LIDOCAINE HCL (CARDIAC) PF 100 MG/5ML IV SOSY
PREFILLED_SYRINGE | INTRAVENOUS | Status: DC | PRN
  Administered 2020-10-12: 100 mg via INTRAVENOUS

## 2020-10-12 MED ORDER — GLYCOPYRROLATE PF 0.2 MG/ML IJ SOSY
PREFILLED_SYRINGE | INTRAMUSCULAR | Status: AC
  Filled 2020-10-12: qty 2

## 2020-10-12 MED ORDER — LIDOCAINE HCL (PF) 2 % IJ SOLN
INTRAMUSCULAR | Status: AC
  Filled 2020-10-12: qty 5

## 2020-10-12 MED ORDER — CEFAZOLIN SODIUM-DEXTROSE 2-3 GM-%(50ML) IV SOLR
INTRAVENOUS | Status: DC | PRN
  Administered 2020-10-12: 2 g via INTRAVENOUS

## 2020-10-12 MED ORDER — MEPERIDINE HCL 25 MG/ML IJ SOLN: 6.2500 mg | INTRAMUSCULAR | Status: DC | PRN

## 2020-10-12 MED ORDER — FENTANYL CITRATE (PF) 100 MCG/2ML IJ SOLN
INTRAMUSCULAR | Status: DC | PRN
  Administered 2020-10-12: 100 ug via INTRAVENOUS

## 2020-10-12 MED ORDER — FENTANYL CITRATE (PF) 100 MCG/2ML IJ SOLN
INTRAMUSCULAR | Status: AC
  Filled 2020-10-12: qty 2

## 2020-10-12 MED ORDER — FENTANYL CITRATE (PF) 100 MCG/2ML IJ SOLN: 25.0000 ug | INTRAMUSCULAR | Status: DC | PRN

## 2020-10-12 MED ORDER — PROPOFOL 10 MG/ML IV BOLUS
INTRAVENOUS | Status: AC
  Filled 2020-10-12: qty 20

## 2020-10-12 MED ORDER — ACETAMINOPHEN 325 MG PO TABS: 325.0000 mg | ORAL_TABLET | ORAL | Status: DC | PRN

## 2020-10-12 MED ORDER — ONDANSETRON HCL 4 MG/2ML IJ SOLN
INTRAMUSCULAR | Status: AC
  Filled 2020-10-12: qty 2

## 2020-10-12 MED ORDER — OXYCODONE HCL 5 MG PO TABS
5.0000 mg | ORAL_TABLET | Freq: Once | ORAL | Status: DC | PRN
Start: 2020-10-12 — End: 2020-10-12

## 2020-10-12 MED ORDER — DEXAMETHASONE SODIUM PHOSPHATE 10 MG/ML IJ SOLN
INTRAMUSCULAR | Status: AC
  Filled 2020-10-12: qty 1

## 2020-10-12 SURGICAL SUPPLY — 38 items
APL PRP STRL LF DISP 70% ISPRP (MISCELLANEOUS) ×1
BANDAGE ESMARK 6X9 LF (GAUZE/BANDAGES/DRESSINGS) IMPLANT
BLADE CLIPPER SURG (BLADE) IMPLANT
BNDG CMPR 9X6 STRL LF SNTH (GAUZE/BANDAGES/DRESSINGS)
BNDG ELASTIC 6X5.8 VLCR STR LF (GAUZE/BANDAGES/DRESSINGS) ×2 IMPLANT
BNDG ESMARK 6X9 LF (GAUZE/BANDAGES/DRESSINGS)
CHLORAPREP W/TINT 26 (MISCELLANEOUS) ×2 IMPLANT
CLSR STERI-STRIP ANTIMIC 1/2X4 (GAUZE/BANDAGES/DRESSINGS) ×2 IMPLANT
CUFF TOURN SGL QUICK 34 (TOURNIQUET CUFF) ×2
CUFF TRNQT CYL 34X4.125X (TOURNIQUET CUFF) ×1 IMPLANT
DISSECTOR 3.5MM X 13CM CVD (MISCELLANEOUS) IMPLANT
DISSECTOR 4.0MMX13CM CVD (MISCELLANEOUS) ×2 IMPLANT
DRAPE ARTHROSCOPY W/POUCH 90 (DRAPES) ×2 IMPLANT
DRAPE IMP U-DRAPE 54X76 (DRAPES) ×2 IMPLANT
DRAPE U-SHAPE 47X51 STRL (DRAPES) ×2 IMPLANT
GAUZE SPONGE 4X4 12PLY STRL (GAUZE/BANDAGES/DRESSINGS) ×2 IMPLANT
GLOVE SRG 8 PF TXTR STRL LF DI (GLOVE) ×1 IMPLANT
GLOVE SURG ENC MOIS LTX SZ6.5 (GLOVE) ×2 IMPLANT
GLOVE SURG LTX SZ8 (GLOVE) ×4 IMPLANT
GLOVE SURG UNDER POLY LF SZ6.5 (GLOVE) ×2 IMPLANT
GLOVE SURG UNDER POLY LF SZ8 (GLOVE) ×2
GOWN STRL REUS W/ TWL LRG LVL3 (GOWN DISPOSABLE) ×1 IMPLANT
GOWN STRL REUS W/TWL LRG LVL3 (GOWN DISPOSABLE) ×2
GOWN STRL REUS W/TWL XL LVL3 (GOWN DISPOSABLE) ×2 IMPLANT
KIT TURNOVER KIT B (KITS) ×2 IMPLANT
MANIFOLD NEPTUNE II (INSTRUMENTS) IMPLANT
NDL SAFETY ECLIPSE 18X1.5 (NEEDLE) ×1 IMPLANT
NEEDLE HYPO 18GX1.5 SHARP (NEEDLE) ×2
NS IRRIG 1000ML POUR BTL (IV SOLUTION) IMPLANT
PACK ARTHROSCOPY DSU (CUSTOM PROCEDURE TRAY) ×2 IMPLANT
PORT APPOLLO RF 90DEGREE MULTI (SURGICAL WAND) IMPLANT
SLEEVE SCD COMPRESS KNEE MED (STOCKING) ×2 IMPLANT
SUT MNCRL AB 4-0 PS2 18 (SUTURE) ×2 IMPLANT
SYR 5ML LUER SLIP (SYRINGE) ×2 IMPLANT
TOWEL GREEN STERILE FF (TOWEL DISPOSABLE) ×2 IMPLANT
TUBE CONNECTING 20X1/4 (TUBING) ×2 IMPLANT
TUBING ARTHROSCOPY IRRIG 16FT (MISCELLANEOUS) ×2 IMPLANT
WATER STERILE IRR 1000ML POUR (IV SOLUTION) ×2 IMPLANT

## 2020-10-12 NOTE — Discharge Instructions (Addendum)
Ophelia Charter MD, MPH Noemi Chapel, PA-C [place] [address] [phone] (tel)   [phone] (fax)   POST-OPERATIVE INSTRUCTIONS - Knee Arthroscopy  WOUND CARE - You may remove the Operative Dressing on Post-Op Day #3 (72hrs after surgery).   -  Alternatively if you would like you can leave dressing on until follow-up if within 7-8 days but keep it dry. - Leave steri-strips in place until they fall off on their own, usually 2 weeks postop. - An ACE wrap may be used to control swelling, do not wrap this too tight.  If the initial ACE wrap feels too tight you may loosen it. - There may be a small amount of fluid/bleeding leaking at the surgical site.  - This is normal; the knee is filled with fluid during the procedure and can leak for 24-48hrs after surgery. You may change/reinforce the bandage as needed.  - Use the Cryocuff or Ice as often as possible for the first 7 days, then as needed for pain relief. Always keep a towel, ACE wrap or other barrier between the cooling unit and your skin.  - You may shower on Post-Op Day #3. Gently pat the area dry. Do not soak the knee in water or submerge it.  - Do not go swimming in the pool or ocean until 4 weeks after surgery or when otherwise instructed.  Keep incisions as dry as possible.   BRACE/AMBULATION -  You will not need a brace after this procedure.   - You can use crutches or a walker initially if needed to help you ambulate, but this is not required - You can put full weight on your operative leg as you feel comfortable  REGIONAL ANESTHESIA (NERVE BLOCKS) - The anesthesia team may have performed a nerve block for you if safe in the setting of your care.  This is a great tool used to minimize pain.  Typically the block may start wearing off overnight.  This can be a challenging period but please utilize your as needed pain medications to try and manage this period and know it will be a brief  transition as the nerve block wears completely   POST-OP MEDICATIONS - Multimodal approach to pain control - In general your pain will be controlled with a combination of substances.  Prescriptions unless otherwise discussed are electronically sent to your pharmacy.  This is a carefully made plan we use to minimize narcotic use.     - Celebrex - Anti-inflammatory medication taken on a scheduled basis - Acetaminophen - Non-narcotic pain medicine taken on a scheduled basis  - Tramadol - This is a strong narcotic, to be used only on an "as needed" basis for SEVERE pain. - Aspirin '81mg'$  - This medicine is used to minimize the risk of blood clots after surgery. -  Phenergan - take as needed for nausea   FOLLOW-UP   Please call the office to schedule a follow-up appointment for your incision check, 7-10 days post-operatively.   IF YOU HAVE ANY QUESTIONS, PLEASE FEEL FREE TO CALL OUR OFFICE.   HELPFUL INFORMATION  - If you had a block, it will wear off between 8-24 hrs postop typically.  This is period when your pain may go from nearly zero to the pain you would have had post-op without the block.  This is an abrupt transition but nothing dangerous is happening.  You may take an extra dose of narcotic when this happens.   Keep your  leg elevated to decrease swelling, which will then in turn decrease your pain. I would elevate the foot of your bed by putting a couple of couch pillows between your mattress and box spring. I would not keep pillow directly under your ankle.  - Do not sleep with a pillow behind your knee even if it is more comfortable as this may make it harder to get your knee fully straight long term.   There will be MORE swelling on days 1-3 than there is on the day of surgery.  This also is normal. The swelling will decrease with the anti-inflammatory medication, ice and keeping it elevated. The swelling will make it more difficult to bend your knee. As the swelling goes down  your motion will become easier   You may develop swelling and bruising that extends from your knee down to your calf and perhaps even to your foot over the next week. Do not be alarmed. This too is normal, and it is due to gravity   There may be some numbness adjacent to the incision site. This may last for 6-12 months or longer in some patients and is expected.   You may return to sedentary work/school in the next couple of days when you feel up to it. You will need to keep your leg elevated as much as possible    You should wean off your narcotic medicines as soon as you are able.  Most patients will be off or using minimal narcotics before their first postop appointment.    We suggest you use the pain medication the first night prior to going to bed, in order to ease any pain when the anesthesia wears off. You should avoid taking pain medications on an empty stomach as it will make you nauseous.   Do not drink alcoholic beverages or take illicit drugs when taking pain medications.   It is against the law to drive while taking narcotics. You cannot drive if your Right leg is in brace locked in extension.   Pain medication may make you constipated.  Below are a few solutions to try in this order:  o Decrease the amount of pain medication if you aren't having pain.  o Drink lots of decaffeinated fluids.  o Drink prune juice and/or eat dried prunes   o If the first 3 don't work start with additional solutions  o Take Colace - an over-the-counter stool softener  o Take Senokot - an over-the-counter laxative  o Take Miralax - a stronger over-the-counter laxative    For more information including helpful videos and documents visit our website:   [email or web address]  Post Anesthesia Home Care Instructions  Activity: Get plenty of rest for the remainder of the day. A responsible individual must stay with you for 24 hours following the procedure.   For the next 24 hours, DO NOT: -Drive a car -Paediatric nurse -Drink alcoholic beverages -Take any medication unless instructed by your physician -Make any legal decisions or sign important papers.  Meals: Start with liquid foods such as gelatin or soup. Progress to regular foods as tolerated. Avoid greasy, spicy, heavy foods. If nausea and/or vomiting occur, drink only clear liquids until the nausea and/or vomiting subsides. Call your physician if vomiting continues.  Special Instructions/Symptoms: Your throat may feel dry or sore from the anesthesia or the breathing tube placed in your throat during surgery. If this causes discomfort, gargle with warm salt water. The discomfort should disappear within 24 hours.  If you  had a scopolamine patch placed behind your ear for the management of post- operative nausea and/or vomiting:  1. The medication in the patch is effective for 72 hours, after which it should be removed.  Wrap patch in a tissue and discard in the trash. Wash hands thoroughly with soap and water. 2. You may remove the patch earlier than 72 hours if you experience unpleasant side effects which may include dry mouth, dizziness or visual disturbances. 3. Avoid touching the patch. Wash your hands with soap and water after contact with the patch.

## 2020-10-12 NOTE — Anesthesia Postprocedure Evaluation (Signed)
Anesthesia Post Note  Patient: Production assistant, radio  Procedure(s) Performed: RIGHT KNEE ARTHROSCOPY WITH MEDIAL MENISECTOMY (Right: Knee)     Patient location during evaluation: Phase II Anesthesia Type: General Level of consciousness: awake Pain management: pain level controlled Vital Signs Assessment: post-procedure vital signs reviewed and stable Respiratory status: spontaneous breathing Cardiovascular status: stable Postop Assessment: no apparent nausea or vomiting Anesthetic complications: no   No notable events documented.  Last Vitals:  Vitals:   10/12/20 1330 10/12/20 1407  BP: 137/87 140/89  Pulse: 77 (!) 102  Resp: 13 14  Temp: 36.7 C 36.7 C  SpO2: 97% 97%    Last Pain:  Vitals:   10/12/20 1407  TempSrc:   PainSc: 0-No pain        RLE Motor Response: Purposeful movement (10/12/20 1407) RLE Sensation: Full sensation (10/12/20 1407)      Huston Foley

## 2020-10-12 NOTE — Anesthesia Preprocedure Evaluation (Signed)
Anesthesia Evaluation  Patient identified by MRN, date of birth, ID band Patient awake    Reviewed: Allergy & Precautions, NPO status , Patient's Chart, lab work & pertinent test results  Airway Mallampati: I       Dental no notable dental hx.    Pulmonary former smoker,    Pulmonary exam normal        Cardiovascular negative cardio ROS Normal cardiovascular exam     Neuro/Psych negative neurological ROS  negative psych ROS   GI/Hepatic negative GI ROS, Neg liver ROS,   Endo/Other    Renal/GU   negative genitourinary   Musculoskeletal  (+) Arthritis , Osteoarthritis,    Abdominal Normal abdominal exam  (+)   Peds  Hematology   Anesthesia Other Findings   Reproductive/Obstetrics                             Anesthesia Physical Anesthesia Plan  ASA: 2  Anesthesia Plan: General   Post-op Pain Management:    Induction: Intravenous  PONV Risk Score and Plan: 4 or greater and Ondansetron, Dexamethasone and Midazolam  Airway Management Planned: LMA  Additional Equipment:   Intra-op Plan:   Post-operative Plan: Extubation in OR  Informed Consent: I have reviewed the patients History and Physical, chart, labs and discussed the procedure including the risks, benefits and alternatives for the proposed anesthesia with the patient or authorized representative who has indicated his/her understanding and acceptance.       Plan Discussed with: CRNA  Anesthesia Plan Comments:         Anesthesia Quick Evaluation

## 2020-10-12 NOTE — Op Note (Signed)
Orthopaedic Surgery Operative Note (CSN: [id number])  Teresa Morton  Dec 27, 1955 Date of Surgery: 10/12/2020   Diagnoses:  right knee meniscus tear  Procedure: Right medial meniscus tear with root component debrided back   Operative Finding Exam under anesthesia:  no limitation in flexion, patient likely lacked about 3 degrees short of full extension. Suprapatellar pouch: Some cartilaginous debris that was debrided away, all small 1 x 1 mm lesions Patellofemoral Compartment: Grade 4 patellofemoral chondrosis Medial Compartment: Grade 3 scattered changes throughout the medial most aspect the medial femoral condyle.  No obvious areas of grade 4.  The generative meniscal root tear for 5 mm from the posterior medial root debrided back to a stable base, 30% total meniscal volume resected. Lateral Compartment: Normal Intercondylar Notch: Normal  Successful completion of the planned procedure.  Patient had significant chondrosis of the patellofemoral and medial compartments.  We performed a partial medial meniscectomy and chondroplasty as we did not feel that it would be in her best interest to do a root repair.  She is at high risk for continued pain in which case an arthroplasty would likely be a reasonable option.  Post-operative plan: The patient will be weightbearing to tolerance.  The patient will be discharged home.  DVT prophylaxis Aspirin 81 mg twice daily for 6 weeks.  Pain control with PRN pain medication preferring oral medicines.  Follow up plan will be scheduled in approximately 7 days for incision check.  Post-Op Diagnosis: Same Surgeons:Primary: Hiram Gash, MD Assistants:Caroline McBane PA-C Location: [facility] OR ROOM 6 Anesthesia: General with local Antibiotics: Ancef Tourniquet time: * No tourniquets in log * Estimated Blood Loss: Minimal Complications: None Specimens: None Implants: * No implants in log *  Indications for Surgery:   Teresa Morton is a 65 y.o.  female with continued medial knee pain and previous history of contralateral surgery that did well.  MRI demonstrated medial joint line changes as well as a root tear.  Mechanical symptoms were noted.  Benefits and risks of operative and nonoperative management were discussed prior to surgery with patient/guardian(s) and informed consent form was completed.  Specific risks including infection, need for additional surgery, continued pain, post meniscectomy syndrome amongst others.   Procedure:   The patient was identified properly. Informed consent was obtained and the surgical site was marked. The patient was taken up to suite where general anesthesia was induced. The patient was placed in the supine position with a post against the surgical leg and a nonsterile tourniquet applied. The surgical leg was then prepped and draped usual sterile fashion.  A standard surgical timeout was performed.  2 standard anterior portals were made and diagnostic arthroscopy performed. Please note the findings as noted above.  We performed a gentle partial medial meniscectomy and chondroplasty using a shaver as well as a basket.  We debrided back to a stable base.  30% total meniscal volume resected.  We removed significant debris that was cartilaginous in the superior pouch with a shaver.  Incisions closed with absorbable suture. The patient was awoken from general anesthesia and taken to the PACU in stable condition without complication.   Noemi Chapel, PA-C, present and scrubbed throughout the case, critical for completion in a timely fashion, and for retraction, instrumentation, closure.

## 2020-10-12 NOTE — Anesthesia Procedure Notes (Signed)
Procedure Name: LMA Insertion Date/Time: 10/12/2020 12:47 PM Performed by: Verita Lamb, CRNA Pre-anesthesia Checklist: Patient identified, Emergency Drugs available, Suction available and Patient being monitored Patient Re-evaluated:Patient Re-evaluated prior to induction Oxygen Delivery Method: Circle system utilized Preoxygenation: Pre-oxygenation with 100% oxygen Induction Type: IV induction Ventilation: Mask ventilation without difficulty LMA: LMA inserted LMA Size: 4.0 Number of attempts: 1 Airway Equipment and Method: Bite block Placement Confirmation: positive ETCO2, CO2 detector and breath sounds checked- equal and bilateral Tube secured with: Tape Dental Injury: Teeth and Oropharynx as per pre-operative assessment

## 2020-10-12 NOTE — Transfer of Care (Signed)
Immediate Anesthesia Transfer of Care Note  Patient: Teresa Morton  Procedure(s) Performed: RIGHT KNEE ARTHROSCOPY WITH MEDIAL MENISECTOMY (Right: Knee)  Patient Location: PACU  Anesthesia Type:General  Level of Consciousness: awake, alert  and oriented  Airway & Oxygen Therapy: Patient Spontanous Breathing and Patient connected to face mask oxygen  Post-op Assessment: Report given to RN and Post -op Vital signs reviewed and stable  Post vital signs: Reviewed and stable  Last Vitals:  Vitals Value Taken Time  BP    Temp    Pulse    Resp    SpO2      Last Pain:  Vitals:   10/12/20 1121  TempSrc: Oral  PainSc: 0-No pain      Patients Stated Pain Goal: 3 ([id number])  Complications: No notable events documented.

## 2020-10-13 ENCOUNTER — Encounter (HOSPITAL_BASED_OUTPATIENT_CLINIC_OR_DEPARTMENT_OTHER): Payer: Self-pay | Admitting: Orthopaedic Surgery

## 2020-10-19 LAB — ANAEROBIC AND AEROBIC CULTURE
AER RESULT:: NO GROWTH
MICRO NUMBER:: 12192607
MICRO NUMBER:: 12192608
SPECIMEN QUALITY:: ADEQUATE
SPECIMEN QUALITY:: ADEQUATE

## 2020-12-26 ENCOUNTER — Other Ambulatory Visit: Payer: Self-pay

## 2020-12-26 ENCOUNTER — Ambulatory Visit: Payer: 59 | Admitting: Physical Therapy

## 2020-12-26 ENCOUNTER — Encounter: Payer: Self-pay | Admitting: Physical Therapy

## 2020-12-26 DIAGNOSIS — R29898 Other symptoms and signs involving the musculoskeletal system: Secondary | ICD-10-CM

## 2020-12-26 DIAGNOSIS — M25561 Pain in right knee: Secondary | ICD-10-CM

## 2020-12-26 DIAGNOSIS — M6281 Muscle weakness (generalized): Secondary | ICD-10-CM

## 2020-12-26 NOTE — Therapy (Addendum)
[facility], [place], [address] Phone: [phone]   Fax:  [phone]  Physical Therapy Evaluation and Discharge  Patient Details  Name: Teresa Morton MRN: [id number] Date of Birth: 1955/11/03 Referring Provider (PT): Ophelia Charter   Encounter Date: 12/26/2020   PT End of Session - 12/26/20 0927     Visit Number 1    Number of Visits 6    Date for PT Re-Evaluation 02/06/21    PT Start Time 0845    PT Stop Time 0925    PT Time Calculation (min) 40 min    Activity Tolerance Patient tolerated treatment well    Behavior During Therapy [facility] for tasks assessed/performed             Past Medical History:  Diagnosis Date   Arthritis    OA bil knees   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    Subjective Assessment - 12/26/20 0848     Subjective Pt had insiduous onset of Rt knee pain about 1 week ago. She had Rt knee arthroscopy with medial meniscus repair 09/2020 but the MD states that this is not related. Pain increases with seated hip flexion "marching" motion when she puts her Rt LE into the car and with getting pants and shoes on. Pain decreases with rest. Pt had injection to Rt knee 12/23/20 but has had no relief    Pertinent History bilat knee arthroscopy    Patient Stated Goals decrease pain with dressing and getting into the car    Currently in Pain? Yes    Pain Score 2     Pain Location Knee    Pain Orientation Right     Pain Descriptors / Indicators Aching    Pain Type Acute pain    Pain Onset 1 to 4 weeks ago    Pain Frequency Intermittent    Aggravating Factors  getting into car, dressing    Pain Relieving Factors rest                [facility] PT Assessment - 12/26/20 0001       Assessment   Medical Diagnosis Rt HS tendonitis    Referring Provider (PT) Griffin Basil, Dax    Onset Date/Surgical Date 12/19/20    Hand Dominance Left    Next MD Visit PRN    Prior Therapy none      Precautions   Precautions None      Restrictions   Weight Bearing Restrictions No      Balance Screen   Has the patient fallen in the past 6 months No      Prior Function   Level of Independence Independent      Observation/Other Assessments   Focus on Therapeutic Outcomes (FOTO)  76      Functional Tests   Functional tests Squat;Step down;Single leg stance      Squat   Comments good alignment, no pain      Step  Down   Comments no pain      Single Leg Stance   Comments no pain, 10 seconds bilat      ROM / Strength   AROM / PROM / Strength Strength;AROM      AROM   AROM Assessment Site Knee    Right/Left Knee Right;Left    Right Knee Extension 0    Right Knee Flexion 135    Left Knee Extension 0    Left Knee Flexion 145      Strength   Overall Strength Comments bilat hip flex 3+/5, Rt hip abd 3+/5    Strength Assessment Site Knee    Right/Left Knee Right;Left    Right Knee Flexion 4-/5    Right Knee Extension 4+/5    Left Knee Flexion 4/5    Left Knee Extension 4+/5      Flexibility   Soft Tissue Assessment /Muscle Length yes    Hamstrings decreased bilat    Quadriceps decreased Rt LE      Palpation   Patella mobility WFL    Palpation comment TTP Rt knee infrapatellar bursa      Special Tests   Other special tests Ober negative bilat,                        Objective measurements completed on examination: See above findings.       [place] Adult PT Treatment/Exercise -  12/26/20 0001       Exercises   Exercises Knee/Hip      Knee/Hip Exercises: Stretches   Passive Hamstring Stretch 30 seconds    Passive Hamstring Stretch Limitations with strap    Quad Stretch 30 seconds    Quad Stretch Limitations prone with strap      Knee/Hip Exercises: Supine   Single Leg Bridge 10 reps   bilat LE, figure 4   Straight Leg Raises 10 reps    Straight Leg Raise with External Rotation 10 reps      Knee/Hip Exercises: Sidelying   Hip ABduction 10 reps      Modalities   Modalities Cryotherapy      Cryotherapy   Number Minutes Cryotherapy 10 Minutes    Cryotherapy Location Knee    Type of Cryotherapy Ice pack                     PT Education - 12/26/20 0915     Education Details PT POC and goals, HEP    Person(s) Educated Patient    Methods Explanation;Demonstration;Handout    Comprehension Returned demonstration                 PT Long Term Goals - 12/26/20 0931       PT LONG TERM GOAL #1   Title Pt will be independent in HEP    Time 6    Period Weeks    Status New    Target Date 02/06/21      PT LONG TERM GOAL #2   Title Pt will improve FOTO to >= 85 to demo improved functional mobility    Time 6    Period Weeks    Status New    Target Date 02/06/21      PT LONG TERM GOAL #3   Title Pt will improve Rt LE strength to 4+/5 to tolerate walking with decreased pain    Time 6    Period Weeks    Status New  Target Date 02/06/21      PT LONG TERM GOAL #4   Title Pt will perform putting on pants and shoes with Rt knee pain <= 1/10    Time 6    Period Weeks    Status New    Target Date 02/06/21                    Plan - 12/26/20 7841     Clinical Impression Statement Pt is a 65 y/o female referred for Rt hamstring tendonitis and bursitis of Rt knee. Pt presents with increased pain, decreased strength and ROM, decreased activity tolerance and will benefit from skilled PT to address deficits and improve  functional mobility    Personal Factors and Comorbidities Comorbidity 2;Past/Current Experience;Time since onset of injury/illness/exacerbation    Examination-Activity Limitations Dressing    Examination-Participation Restrictions Driving    Stability/Clinical Decision Making Stable/Uncomplicated    Clinical Decision Making Low    Rehab Potential Good    PT Frequency 1x / week    PT Duration 6 weeks    PT Treatment/Interventions Aquatic Therapy;Cryotherapy;Moist Heat;Iontophoresis 46m/ml Dexamethasone;Electrical Stimulation;Gait training;Stair training;Neuromuscular re-education;Balance training;Therapeutic exercise;Therapeutic activities;Patient/family education;Manual techniques;Taping;Dry needling;Vasopneumatic Device    PT Next Visit Plan assess HEP, progres standing exercises    PT Home Exercise Plan [email or web address]    Consulted and Agree with Plan of Care Patient             Patient will benefit from skilled therapeutic intervention in order to improve the following deficits and impairments:  Pain, Impaired flexibility, Decreased activity tolerance, Decreased strength, Decreased range of motion, Difficulty walking, Decreased endurance  Visit Diagnosis: Acute pain of right knee - Plan: PT plan of care cert/re-cert  Muscle weakness (generalized) - Plan: PT plan of care cert/re-cert  Other symptoms and signs involving the musculoskeletal system - Plan: PT plan of care cert/re-cert     Problem List Patient Active Problem List   Diagnosis Date Noted   Primary osteoarthritis of both knees 10/07/2019   Lateral epicondylitis of right elbow 10/07/2019   Hepatic lesion 02/15/2019   Uterine fibroid 12/03/2018   Abdominal fullness 12/02/2018   Dark urine 12/02/2018   Left nephrolithiasis 10/17/2017   Postmenopausal 09/25/2017   History of kidney stones 09/25/2017   [place] 09/25/2017   Hemorrhoids 09/25/2017   PHYSICAL THERAPY DISCHARGE SUMMARY  Visits from  Start of Care: 1  Current functional level related to goals / functional outcomes: Educated on HEP   Remaining deficits: See above   Education / Equipment: HEP   Patient agrees to discharge. Patient goals were not met. Patient is being discharged due to not returning since the last visit. KIsabelle Course PT,DPT12/08/2209:45 AM   DIsabelle Course PT 12/26/2020, 10:28 AM  [facility] [address]Phone: [phone]  Fax:  [phone] Name: Teresa DelahuntMRN: [id number] of Birth: 3April 29, 1957

## 2020-12-26 NOTE — Patient Instructions (Signed)
Access Code: [email or web address] URL: [email or web address] Date: 12/26/2020 Prepared by: Isabelle Course  Exercises Hooklying Hamstring Stretch with Strap - 1 x daily - 7 x weekly - 3 sets - 1 reps - 20-30 seconds hold Prone Quadriceps Stretch with Strap - 1 x daily - 7 x weekly - 3 sets - 1 reps - 20-30 seconds hold Active Straight Leg Raise with Quad Set - 1 x daily - 7 x weekly - 3 sets - 10 reps Straight Leg Raise with External Rotation - 1 x daily - 7 x weekly - 3 sets - 10 reps Figure 4 Bridge - 1 x daily - 7 x weekly - 3 sets - 10 reps Sidelying Hip Abduction - 1 x daily - 7 x weekly - 3 sets - 10 reps

## 2021-01-05 ENCOUNTER — Encounter: Payer: 59 | Admitting: Physical Therapy

## 2021-03-05 ENCOUNTER — Other Ambulatory Visit: Payer: Self-pay | Admitting: Advanced Practice Midwife

## 2021-03-05 DIAGNOSIS — Z1231 Encounter for screening mammogram for malignant neoplasm of breast: Secondary | ICD-10-CM

## 2021-03-14 ENCOUNTER — Other Ambulatory Visit: Payer: Self-pay

## 2021-03-14 ENCOUNTER — Ambulatory Visit (INDEPENDENT_AMBULATORY_CARE_PROVIDER_SITE_OTHER): Payer: 59

## 2021-03-14 DIAGNOSIS — Z1231 Encounter for screening mammogram for malignant neoplasm of breast: Secondary | ICD-10-CM | POA: Diagnosis not present

## 2021-11-09 ENCOUNTER — Other Ambulatory Visit: Payer: Self-pay | Admitting: Orthopaedic Surgery

## 2021-11-09 DIAGNOSIS — M7051 Other bursitis of knee, right knee: Secondary | ICD-10-CM

## 2021-11-18 ENCOUNTER — Ambulatory Visit (INDEPENDENT_AMBULATORY_CARE_PROVIDER_SITE_OTHER): Payer: 59

## 2021-11-18 DIAGNOSIS — M7051 Other bursitis of knee, right knee: Secondary | ICD-10-CM

## 2021-11-20 ENCOUNTER — Telehealth: Payer: Self-pay

## 2021-11-20 NOTE — Telephone Encounter (Signed)
Patient called requesting gel injections in her right knee. Please advise.

## 2021-11-20 NOTE — Telephone Encounter (Signed)
Yes I got a text from Dr. Griffin Basil, please work on Orthovisc or any other viscosupplement, x-ray confirmed osteoarthritis, failed steroid injections, greater than 6 weeks of therapy.

## 2021-11-21 ENCOUNTER — Telehealth: Payer: Self-pay | Admitting: Sports Medicine

## 2021-11-21 NOTE — Telephone Encounter (Signed)
Submitted paperwork to initiate authorization. Will let patient know when I hear back from insurance.

## 2021-11-21 NOTE — Telephone Encounter (Signed)
Pt called and states she needs a gel injection for her right knee but that the recommending provider also stated it may need prior authorization. Directed to check before scheduling, please advise. Teresa Morton

## 2021-11-22 NOTE — Telephone Encounter (Signed)
Benefits Investigation Details received from [employer] Injection: Orthovisc  Medical: Deductible applies.  PA required: Yes  Call to [facility] [place] # [phone] good through 05/20/2022.   Pharmacy: Product not covered under pharmacy plan.   Specialty Pharmacy: [facility]  May fill through: [name] and [name] Copay/Coinsurance: [id number] Product Copay: 20% ($140) Administration Coinsurance:  Administration Copay:  Deductible: $1500 (met: $4.48) $1495.52 Out of Pocket Max: $4000 (met: $70.04)    Patient aware of her approx OOP cost and stated that she will contact her insurance company because she thought they have met their deductible. She will call back with any additional information.

## 2021-11-28 NOTE — Telephone Encounter (Signed)
Patient verified with her insurance that she has not met her deductible and would have to pay out of pocket for the injections. She stated that she will be going on [facility] in march and will wait til then.

## 2022-03-11 ENCOUNTER — Other Ambulatory Visit: Payer: Self-pay | Admitting: Family Medicine

## 2022-03-11 DIAGNOSIS — Z1231 Encounter for screening mammogram for malignant neoplasm of breast: Secondary | ICD-10-CM

## 2022-03-27 ENCOUNTER — Ambulatory Visit: Payer: 59

## 2022-04-10 ENCOUNTER — Ambulatory Visit (INDEPENDENT_AMBULATORY_CARE_PROVIDER_SITE_OTHER): Payer: 59

## 2022-04-10 DIAGNOSIS — Z1231 Encounter for screening mammogram for malignant neoplasm of breast: Secondary | ICD-10-CM | POA: Diagnosis not present

## 2022-06-03 ENCOUNTER — Encounter: Payer: Self-pay | Admitting: *Deleted

## 2022-06-19 IMAGING — DX DG KNEE 1-2V*R*
2 series · 2 of 2 positions shown · non-contrast
Comparison: None.

CLINICAL DATA: Knee pain

EXAM:
RIGHT KNEE - 1-2 VIEW

[knee ap bilat standing (1 of 2)]
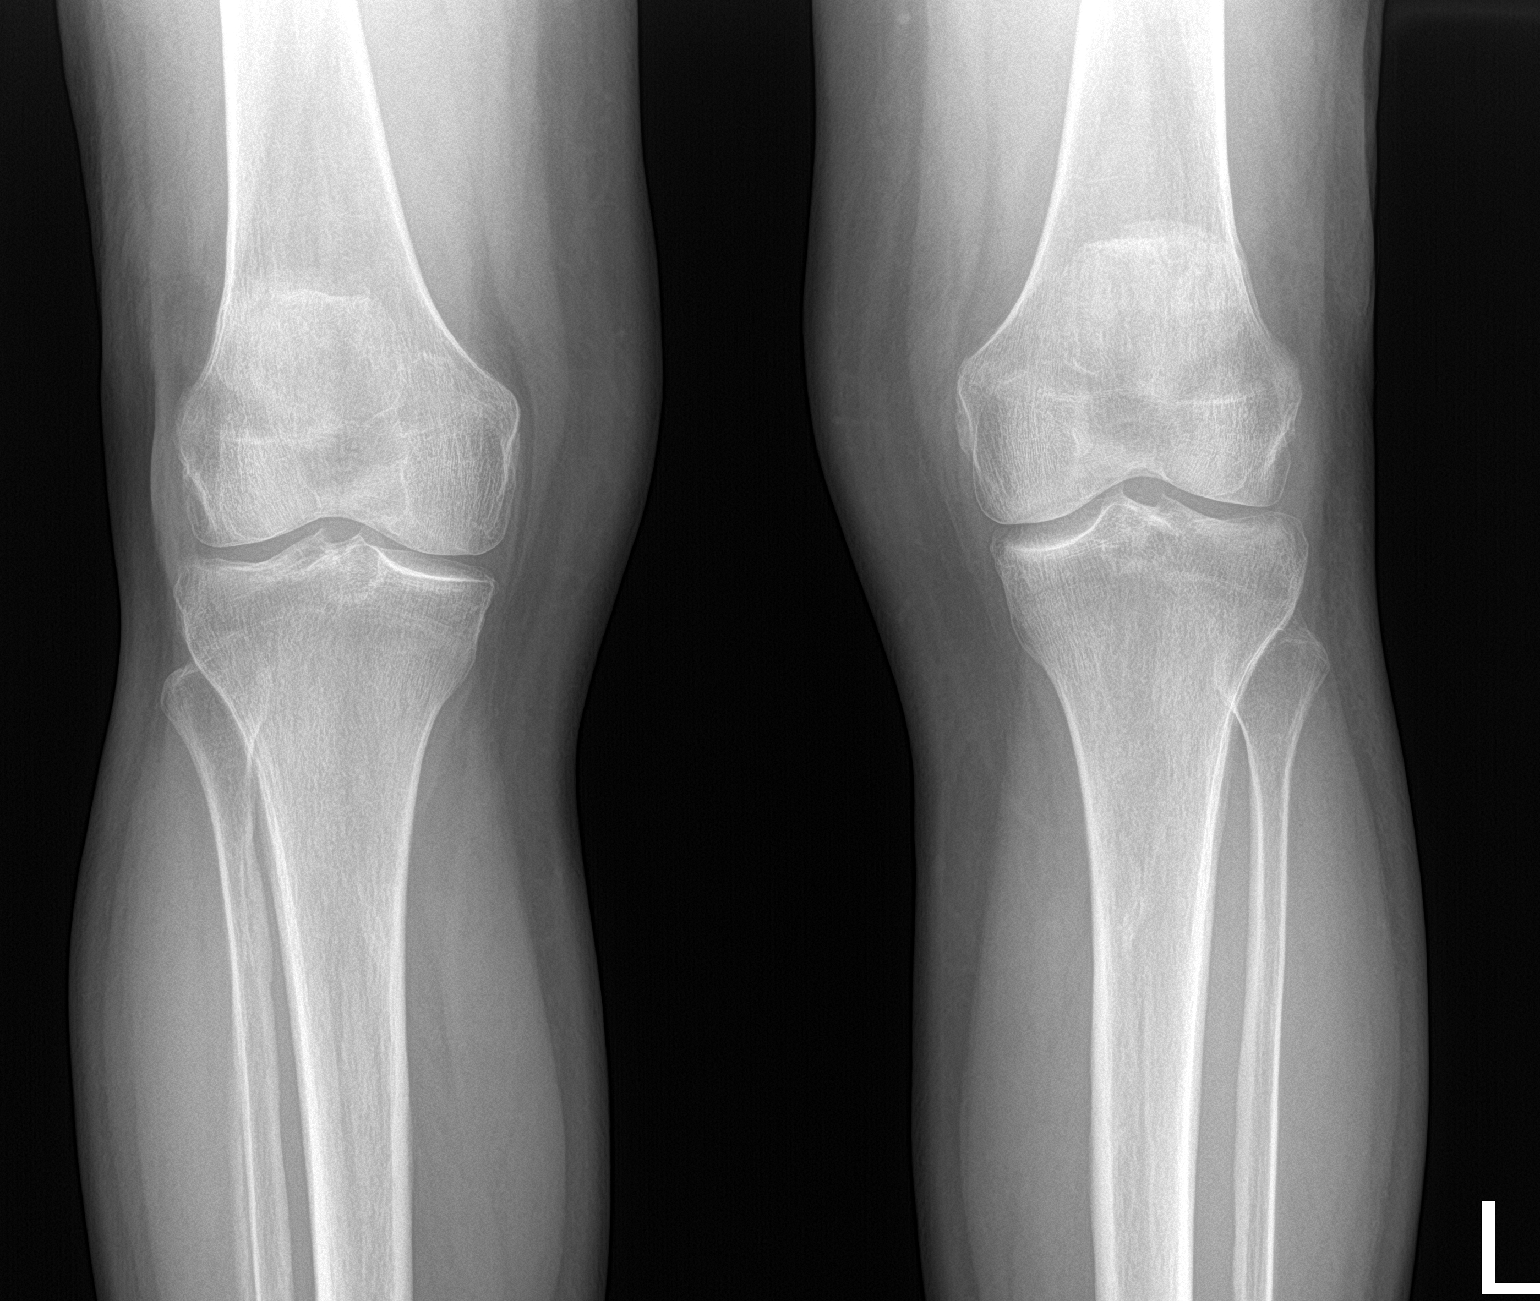

[knee ap bilat standing (2 of 2)]
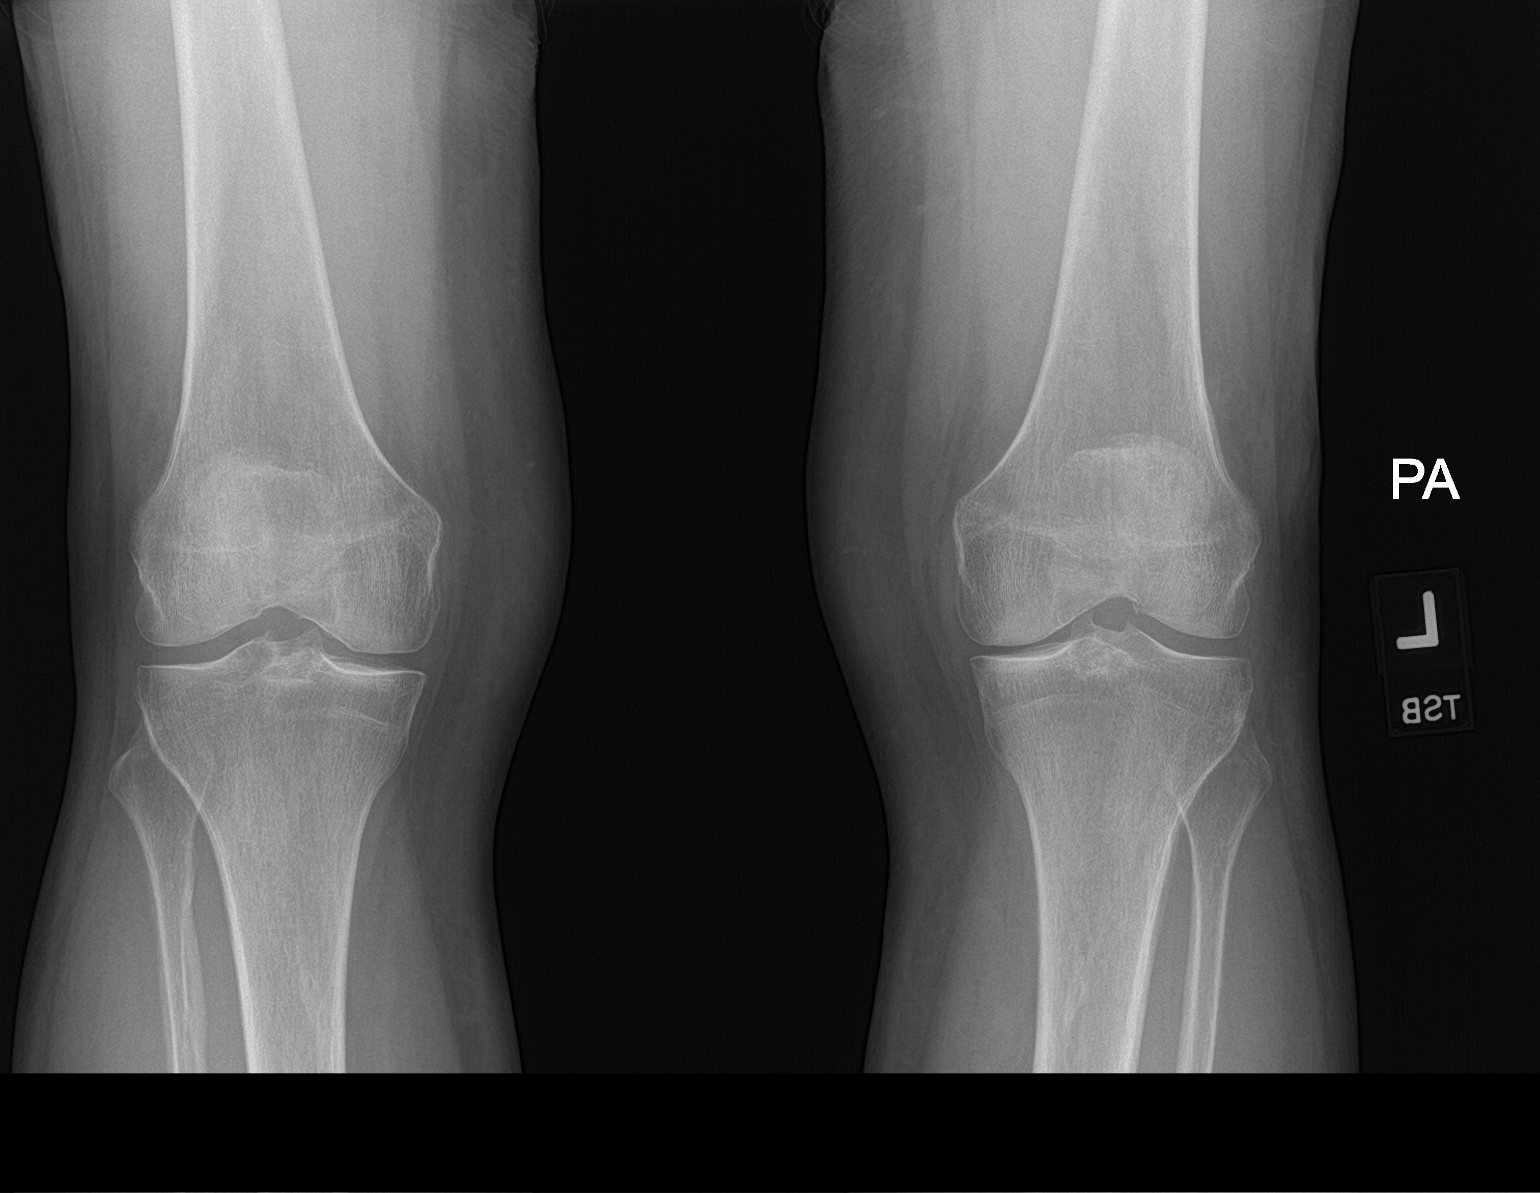

[2 of 2 positions shown; findings below may reference images not displayed]

FINDINGS: No fracture or malalignment. The joint spaces appear grossly
maintained
IMPRESSION: Negative.

## 2022-07-30 ENCOUNTER — Ambulatory Visit: Payer: 59 | Admitting: Physical Therapy

## 2022-07-30 ENCOUNTER — Ambulatory Visit: Payer: Self-pay

## 2022-07-31 ENCOUNTER — Other Ambulatory Visit: Payer: Self-pay

## 2022-07-31 ENCOUNTER — Ambulatory Visit: Payer: Medicare Other | Attending: Sports Medicine | Admitting: Physical Therapy

## 2022-07-31 ENCOUNTER — Encounter: Payer: Self-pay | Admitting: Physical Therapy

## 2022-07-31 DIAGNOSIS — M62831 Muscle spasm of calf: Secondary | ICD-10-CM | POA: Diagnosis present

## 2022-07-31 DIAGNOSIS — M25661 Stiffness of right knee, not elsewhere classified: Secondary | ICD-10-CM | POA: Diagnosis present

## 2022-07-31 DIAGNOSIS — R29898 Other symptoms and signs involving the musculoskeletal system: Secondary | ICD-10-CM | POA: Insufficient documentation

## 2022-07-31 DIAGNOSIS — M25561 Pain in right knee: Secondary | ICD-10-CM | POA: Insufficient documentation

## 2022-07-31 DIAGNOSIS — M6281 Muscle weakness (generalized): Secondary | ICD-10-CM | POA: Diagnosis present

## 2022-07-31 NOTE — Therapy (Signed)
OUTPATIENT PHYSICAL THERAPY LOWER EXTREMITY EVALUATION   Patient Name: Momina Nienaber MRN: [id number] DOB:July 26, 1955, 67 y.o., female Today's Date: 07/31/2022  END OF SESSION:  PT End of Session - 07/31/22 1012     Visit Number 1    Number of Visits 16    Date for PT Re-Evaluation 09/25/22    Authorization Type Medicare    PT Start Time 1015    PT Stop Time 1100    PT Time Calculation (min) 45 min    Activity Tolerance Patient tolerated treatment well             Past Medical History:  Diagnosis Date   Arthritis    OA bil knees   Melanoma in situ of trunk (HCC)    Past Surgical History:  Procedure Laterality Date   CHOLECYSTECTOMY     CHOLECYSTECTOMY, LAPAROSCOPIC  2005   KIDNEY STONE SURGERY     KNEE ARTHROSCOPY Left 04/2020   KNEE ARTHROSCOPY WITH MEDIAL MENISECTOMY Right 10/12/2020   Procedure: RIGHT KNEE ARTHROSCOPY WITH MEDIAL MENISECTOMY;  Surgeon: Bjorn Pippin, MD;  Location: [facility];  Service: Orthopedics;  Laterality: Right;   SALPINGOOPHORECTOMY  2007   Patient Active Problem List   Diagnosis Date Noted   Primary osteoarthritis of both knees 10/07/2019   Lateral epicondylitis of right elbow 10/07/2019   Hepatic lesion 02/15/2019   Uterine fibroid 12/03/2018   Abdominal fullness 12/02/2018   Dark urine 12/02/2018   Left nephrolithiasis 10/17/2017   Postmenopausal 09/25/2017   History of kidney stones 09/25/2017   Melanoma in situ of trunk (HCC) 09/25/2017   Hemorrhoids 09/25/2017    PCP: Arva Chafe  REFERRING PROVIDER: Elige Ko., MD  REFERRING DIAG: [phone] (ICD-10-CM) - Pain in right knee  THERAPY DIAG:  Stiffness of right knee, not elsewhere classified  Muscle spasm of calf  Muscle weakness (generalized)  Acute pain of right knee  Other symptoms and signs involving the musculoskeletal system  Rationale for Evaluation and Treatment: Rehabilitation  ONSET DATE: ~10 years, started worsening 3 years  ago  SUBJECTIVE:   SUBJECTIVE STATEMENT: Pt reports chronic knee pain (R worse than L). Pt has had MRIs and x-rays which demos arthritis and torn meniscus. Pt does report history of arthroscopic surgery that lasted a whole year. Pt states she has had a cortisone shot that lasted a year as well. Reported it started to hurt really bad again and got another cortisone shot in her R knee which did not do as well. Pt states she does not stretch but loves to walk and ride her bike. Pt states pain is more in her calf rather than her knee.   PERTINENT HISTORY: Bilat knee arthritis  PAIN:  Are you having pain? Yes: NPRS scale: currently 3, at worst 8/10 Pain location: R calf Pain description: dull, aching Aggravating factors: Sitting for prolonged periods (~20 min) Relieving factors: Moving  PRECAUTIONS: None  WEIGHT BEARING RESTRICTIONS: No  FALLS:  Has patient fallen in last 6 months? No  LIVING ENVIRONMENT: Lives with: lives with their spouse Lives in: House/apartment Stairs: No Has following equipment at home: None  OCCUPATION: Retired - likes biking and walking; plans to volunteer  PLOF: Independent  PATIENT GOALS: Get rid of pain  NEXT MD VISIT: n/a  OBJECTIVE:   DIAGNOSTIC FINDINGS: Nothing recent on file  PATIENT SURVEYS:  LEFS 98.8%  COGNITION: Overall cognitive status: Within functional limits for tasks assessed     SENSATION: WFL  EDEMA:  None  noted  MUSCLE LENGTH: Hamstrings: Right 90 deg; Left 90 deg Thomas test: Right 10 deg; Left 10 deg  POSTURE: weight shift left and Right knee slightly flexed  PALPATION: TTP R gastroc/soleus  LOWER EXTREMITY ROM:  Active ROM Right eval Left eval  Hip flexion    Hip extension    Hip abduction    Hip adduction    Hip internal rotation    Hip external rotation    Knee flexion 120 135  Knee extension -14 -5  Ankle dorsiflexion    Ankle plantarflexion    Ankle inversion    Ankle eversion     (Blank  rows = not tested)  LOWER EXTREMITY MMT:  MMT Right eval Left eval  Hip flexion 3+ 3+  Hip extension 3+ 4-  Hip abduction 3 4-  Hip adduction    Hip internal rotation 4 4  Hip external rotation 3+ 4  Knee flexion 4+ 4+  Knee extension 5 5  Ankle dorsiflexion 4 5  Ankle plantarflexion 5 5  Ankle inversion    Ankle eversion     (Blank rows = not tested)  LOWER EXTREMITY SPECIAL TESTS:  Hip special tests: Luisa Hart (FABER) test: negative and Thomas test: negative Tighter in L hip during FABER  FUNCTIONAL TESTS:  Single leg heel raise normal bilat  GAIT: Distance walked: 100' Assistive device utilized: None Level of assistance: Complete Independence Comments: R knee more flexed than L during stance phase   TODAY'S TREATMENT:                                                                                                                              DATE: 07/31/22 See HEP below    PATIENT EDUCATION:  Education details: Exam findings, POC, initial HEP Person educated: Patient Education method: Explanation, Demonstration, and Handouts Education comprehension: verbalized understanding, returned demonstration, and needs further education  HOME EXERCISE PROGRAM: Access Code: [email or web address] URL: [email or web address] Date: 07/31/2022 Prepared by: Vernon Prey April Kirstie Peri  Exercises - Clamshell with Resistance  - 1 x daily - 7 x weekly - 3 sets - 10 reps - 3 sec hold - Supine Active Straight Leg Raise  - 1 x daily - 7 x weekly - 3 sets - 10 reps - Hip Extension with Resistance Loop  - 1 x daily - 7 x weekly - 3 sets - 10 reps - Gastroc Stretch on Wall  - 1 x daily - 7 x weekly - 2 sets - 30 sec hold - Standing Soleus Stretch on Foam 1/2 Roll  - 1 x daily - 7 x weekly - 2 sets - 30 sec hold - Seated Hamstring Stretch  - 1 x daily - 7 x weekly - 2 sets - 30 sec hold  ASSESSMENT:  CLINICAL IMPRESSION: Patient is a 67 y.o. F who was seen today for physical therapy  evaluation and treatment for R>L LE pain. Assessment significant for reduced R knee ROM compared  to L with bilat hip weakness (R worse than L). Pt points to most of her pain in her R gastroc/soleus with visible decrease in R ankle DF compared to L. Pt will benefit from PT to address these deficits for improved pain with all of her home and community tasks.   OBJECTIVE IMPAIRMENTS: decreased activity tolerance, decreased mobility, decreased ROM, decreased strength, increased fascial restrictions, increased muscle spasms, impaired flexibility, improper body mechanics, and pain.   ACTIVITY LIMITATIONS: sitting, squatting, and locomotion level  PARTICIPATION LIMITATIONS: community activity  PERSONAL FACTORS: Fitness, Past/current experiences, and Time since onset of injury/illness/exacerbation are also affecting patient's functional outcome.   REHAB POTENTIAL: Good  CLINICAL DECISION MAKING: Stable/uncomplicated  EVALUATION COMPLEXITY: Low   GOALS: Goals reviewed with patient? Yes  SHORT TERM GOALS: Target date: 08/28/2022  Pt will be ind with initial HEP Baseline: Goal status: INITIAL   LONG TERM GOALS: Target date: 09/25/2022   Pt will be ind with management and progression of HEP Baseline:  Goal status: INITIAL  2.  Pt will demo improved R knee ROM from -5 to 125 deg Baseline:  Goal status: INITIAL  3.  Pt will report >/=75% improvement in her pain Baseline:  Goal status: INITIAL  4.  Pt will demo at least 4/5 bilat hip strength for improved knee stability and endurance Baseline:  Goal status: INITIAL  5.  Pt will have LEFs of 80/80 Baseline:  Goal status: INITIAL    PLAN:  PT FREQUENCY: 2x/week  PT DURATION: 8 weeks  PLANNED INTERVENTIONS: Therapeutic exercises, Therapeutic activity, Neuromuscular re-education, Balance training, Gait training, Patient/Family education, Self Care, Joint mobilization, Stair training, Aquatic Therapy, Dry Needling, Electrical  stimulation, Cryotherapy, Moist heat, Ultrasound, Ionotophoresis 4mg /ml Dexamethasone, Manual therapy, and Re-evaluation  PLAN FOR NEXT SESSION: Assess response to HEP. Work on Academic librarian. Hip strengthening. Single leg stability. Knee ROM.    Parris Cudworth April Ma L Shakela Donati, PT 07/31/2022, 12:53 PM

## 2022-08-06 ENCOUNTER — Ambulatory Visit: Payer: Medicare Other

## 2022-08-06 DIAGNOSIS — R29898 Other symptoms and signs involving the musculoskeletal system: Secondary | ICD-10-CM

## 2022-08-06 DIAGNOSIS — M62831 Muscle spasm of calf: Secondary | ICD-10-CM

## 2022-08-06 DIAGNOSIS — M6281 Muscle weakness (generalized): Secondary | ICD-10-CM

## 2022-08-06 DIAGNOSIS — M25661 Stiffness of right knee, not elsewhere classified: Secondary | ICD-10-CM

## 2022-08-06 DIAGNOSIS — M25561 Pain in right knee: Secondary | ICD-10-CM

## 2022-08-06 NOTE — Therapy (Signed)
OUTPATIENT PHYSICAL THERAPY LOWER EXTREMITY TREATMENT   Patient Name: Teresa Morton MRN: [id number] DOB:[birth year], 67 y.o., female Today's Date: 08/06/2022  END OF SESSION:  PT End of Session - 08/06/22 0930     Visit Number 2    Number of Visits 16    Date for PT Re-Evaluation 09/25/22    Authorization Type Medicare    Progress Note Due on Visit 10    PT Start Time 0930    PT Stop Time 1013    PT Time Calculation (min) 43 min    Activity Tolerance Patient tolerated treatment well    Behavior During Therapy WFL for tasks assessed/performed             Past Medical History:  Diagnosis Date   Arthritis    OA bil knees   Melanoma in situ of trunk (HCC)    Past Surgical History:  Procedure Laterality Date   CHOLECYSTECTOMY     CHOLECYSTECTOMY, LAPAROSCOPIC  2005   KIDNEY STONE SURGERY     KNEE ARTHROSCOPY Left 04/2020   KNEE ARTHROSCOPY WITH MEDIAL MENISECTOMY Right 10/12/2020   Procedure: RIGHT KNEE ARTHROSCOPY WITH MEDIAL MENISECTOMY;  Surgeon: Bjorn Pippin, MD;  Location: [facility];  Service: Orthopedics;  Laterality: Right;   SALPINGOOPHORECTOMY  2007   Patient Active Problem List   Diagnosis Date Noted   Primary osteoarthritis of both knees 10/07/2019   Lateral epicondylitis of right elbow 10/07/2019   Hepatic lesion 02/15/2019   Uterine fibroid 12/03/2018   Abdominal fullness 12/02/2018   Dark urine 12/02/2018   Left nephrolithiasis 10/17/2017   Postmenopausal 09/25/2017   History of kidney stones 09/25/2017   Melanoma in situ of trunk (HCC) 09/25/2017   Hemorrhoids 09/25/2017    PCP: Arva Chafe  REFERRING PROVIDER: Elige Ko., MD  REFERRING DIAG: [phone] (ICD-10-CM) - Pain in right knee  THERAPY DIAG:  Stiffness of right knee, not elsewhere classified  Muscle spasm of calf  Muscle weakness (generalized)  Acute pain of right knee  Other symptoms and signs involving the musculoskeletal system  Rationale for  Evaluation and Treatment: Rehabilitation  ONSET DATE: ~10 years, started worsening 3 years ago  SUBJECTIVE:   SUBJECTIVE STATEMENT: Patient reports she is having a good day today with minimal pain in calves.   PERTINENT HISTORY: Bilat knee arthritis  PAIN:  Are you having pain? Yes: NPRS scale: currently 3, at worst 8/10 Pain location: R calf Pain description: dull, aching Aggravating factors: Sitting for prolonged periods (~20 min) Relieving factors: Moving  PRECAUTIONS: None  WEIGHT BEARING RESTRICTIONS: No  FALLS:  Has patient fallen in last 6 months? No  LIVING ENVIRONMENT: Lives with: lives with their spouse Lives in: House/apartment Stairs: No Has following equipment at home: None  OCCUPATION: Retired - likes biking and walking; plans to volunteer  PLOF: Independent  PATIENT GOALS: Get rid of pain  NEXT MD VISIT: n/a  OBJECTIVE:   DIAGNOSTIC FINDINGS: Nothing recent on file  PATIENT SURVEYS:  LEFS 98.8%  COGNITION: Overall cognitive status: Within functional limits for tasks assessed     SENSATION: WFL  EDEMA:  None noted  MUSCLE LENGTH: Hamstrings: Right 90 deg; Left 90 deg Thomas test: Right 10 deg; Left 10 deg  POSTURE: weight shift left and Right knee slightly flexed  PALPATION: TTP R gastroc/soleus  LOWER EXTREMITY ROM:  Active ROM Right eval Left eval  Hip flexion    Hip extension    Hip abduction    Hip adduction  Hip internal rotation    Hip external rotation    Knee flexion 120 135  Knee extension -14 -5  Ankle dorsiflexion    Ankle plantarflexion    Ankle inversion    Ankle eversion     (Blank rows = not tested)  LOWER EXTREMITY MMT:  MMT Right eval Left eval  Hip flexion 3+ 3+  Hip extension 3+ 4-  Hip abduction 3 4-  Hip adduction    Hip internal rotation 4 4  Hip external rotation 3+ 4  Knee flexion 4+ 4+  Knee extension 5 5  Ankle dorsiflexion 4 5  Ankle plantarflexion 5 5  Ankle inversion     Ankle eversion     (Blank rows = not tested)  LOWER EXTREMITY SPECIAL TESTS:  Hip special tests: Luisa Hart (FABER) test: negative and Thomas test: negative Tighter in L hip during FABER  FUNCTIONAL TESTS:  Single leg heel raise normal bilat  GAIT: Distance walked: 100' Assistive device utilized: None Level of assistance: Complete Independence Comments: R knee more flexed than L during stance phase   TODAY'S TREATMENT:    [facility] Adult PT Treatment:                                                DATE: 08/06/2022 Therapeutic Exercise: Gastroc & soleus stretches on slant board 2x30" each Supine isometric hip add (ball squeeze) 10x5" & hip abd (belt press) 10x5" Resisted clamshell RTB x15 B Resisted hip ext on diagonal x15 B Prone quad stretch w/strap 3x30" B Standing resisted hip abd GTB crossed at ankles Resisted side stepping GTB Fwd/bkwd resisted monster walks GTB Standing resisted HS curls YTB                                                                                                                             DATE: 07/31/22 See HEP below    PATIENT EDUCATION:  Education details: Exam findings, POC, initial HEP Person educated: Patient Education method: Explanation, Demonstration, and Handouts Education comprehension: verbalized understanding, returned demonstration, and needs further education  HOME EXERCISE PROGRAM: Access Code: [email or web address] URL: [email or web address] Date: 08/06/2022 Prepared by: Carlynn Herald  Exercises - Clamshell with Resistance  - 1 x daily - 7 x weekly - 3 sets - 10 reps - 3 sec hold - Supine Active Straight Leg Raise  - 1 x daily - 7 x weekly - 3 sets - 10 reps - Hip Extension with Resistance Loop  - 1 x daily - 7 x weekly - 3 sets - 10 reps - Gastroc Stretch on Wall  - 1 x daily - 7 x weekly - 2 sets - 30 sec hold - Standing Soleus Stretch on Foam 1/2 Roll  - 1 x daily - 7 x weekly - 2 sets - 30 sec hold - Seated Hamstring  Stretch  - 1 x daily - 7 x weekly - 2 sets - 30 sec hold - Side Stepping with Resistance at Ankles and Counter Support  - 1 x daily - 7 x weekly - 3 sets - 10 reps - Standing Hamstring Curl with Resistance  - 1 x daily - 7 x weekly - 3 sets - 10 reps  ASSESSMENT:  CLINICAL IMPRESSION: Hip and glute strengthening progressed with side lying and standing exercises. Cueing provided to improve upright standing posture and decrease forward trunk lean with standing exercises.   OBJECTIVE IMPAIRMENTS: decreased activity tolerance, decreased mobility, decreased ROM, decreased strength, increased fascial restrictions, increased muscle spasms, impaired flexibility, improper body mechanics, and pain.   ACTIVITY LIMITATIONS: sitting, squatting, and locomotion level  PARTICIPATION LIMITATIONS: community activity  PERSONAL FACTORS: Fitness, Past/current experiences, and Time since onset of injury/illness/exacerbation are also affecting patient's functional outcome.   REHAB POTENTIAL: Good  CLINICAL DECISION MAKING: Stable/uncomplicated  EVALUATION COMPLEXITY: Low   GOALS: Goals reviewed with patient? Yes  SHORT TERM GOALS: Target date: 08/28/2022  Pt will be ind with initial HEP Baseline: Goal status: INITIAL   LONG TERM GOALS: Target date: 09/25/2022  Pt will be ind with management and progression of HEP Baseline:  Goal status: INITIAL  2.  Pt will demo improved R knee ROM from -5 to 125 deg Baseline:  Goal status: INITIAL  3.  Pt will report >/=75% improvement in her pain Baseline:  Goal status: INITIAL  4.  Pt will demo at least 4/5 bilat hip strength for improved knee stability and endurance Baseline:  Goal status: INITIAL  5.  Pt will have LEFs of 80/80 Baseline:  Goal status: INITIAL    PLAN:  PT FREQUENCY: 2x/week  PT DURATION: 8 weeks  PLANNED INTERVENTIONS: Therapeutic exercises, Therapeutic activity, Neuromuscular re-education, Balance training, Gait training,  Patient/Family education, Self Care, Joint mobilization, Stair training, Aquatic Therapy, Dry Needling, Electrical stimulation, Cryotherapy, Moist heat, Ultrasound, Ionotophoresis 4mg /ml Dexamethasone, Manual therapy, and Re-evaluation  PLAN FOR NEXT SESSION: Assess response to HEP. Work on Academic librarian. Hip strengthening. Single leg stability. Knee ROM.    Sanjuana Mae, PTA 08/06/2022, 10:15 AM

## 2022-08-08 ENCOUNTER — Encounter: Payer: Self-pay | Admitting: Physical Therapy

## 2022-08-08 ENCOUNTER — Ambulatory Visit: Payer: Medicare Other | Admitting: Physical Therapy

## 2022-08-08 DIAGNOSIS — M25661 Stiffness of right knee, not elsewhere classified: Secondary | ICD-10-CM | POA: Diagnosis not present

## 2022-08-08 DIAGNOSIS — M6281 Muscle weakness (generalized): Secondary | ICD-10-CM

## 2022-08-08 DIAGNOSIS — M62831 Muscle spasm of calf: Secondary | ICD-10-CM

## 2022-08-08 DIAGNOSIS — M25561 Pain in right knee: Secondary | ICD-10-CM

## 2022-08-08 DIAGNOSIS — R29898 Other symptoms and signs involving the musculoskeletal system: Secondary | ICD-10-CM

## 2022-08-08 NOTE — Therapy (Signed)
OUTPATIENT PHYSICAL THERAPY LOWER EXTREMITY TREATMENT   Patient Name: Teresa Morton MRN: [id number] DOB:February 03, 1956, 67 y.o., female Today's Date: 08/08/2022  END OF SESSION:  PT End of Session - 08/08/22 1010     Visit Number 3    Number of Visits 16    Date for PT Re-Evaluation 09/25/22    Authorization Type Medicare    Progress Note Due on Visit 10    PT Start Time 1012    PT Stop Time 1055    PT Time Calculation (min) 43 min    Activity Tolerance Patient tolerated treatment well    Behavior During Therapy WFL for tasks assessed/performed              Past Medical History:  Diagnosis Date   Arthritis    OA bil knees   Melanoma in situ of trunk (HCC)    Past Surgical History:  Procedure Laterality Date   CHOLECYSTECTOMY     CHOLECYSTECTOMY, LAPAROSCOPIC  2005   KIDNEY STONE SURGERY     KNEE ARTHROSCOPY Left 04/2020   KNEE ARTHROSCOPY WITH MEDIAL MENISECTOMY Right 10/12/2020   Procedure: RIGHT KNEE ARTHROSCOPY WITH MEDIAL MENISECTOMY;  Surgeon: Bjorn Pippin, MD;  Location: [facility];  Service: Orthopedics;  Laterality: Right;   SALPINGOOPHORECTOMY  2007   Patient Active Problem List   Diagnosis Date Noted   Primary osteoarthritis of both knees 10/07/2019   Lateral epicondylitis of right elbow 10/07/2019   Hepatic lesion 02/15/2019   Uterine fibroid 12/03/2018   Abdominal fullness 12/02/2018   Dark urine 12/02/2018   Left nephrolithiasis 10/17/2017   Postmenopausal 09/25/2017   History of kidney stones 09/25/2017   Melanoma in situ of trunk (HCC) 09/25/2017   Hemorrhoids 09/25/2017    PCP: Arva Chafe  REFERRING PROVIDER: Elige Ko., MD  REFERRING DIAG: [id number] (ICD-10-CM) - Pain in right knee  THERAPY DIAG:  Stiffness of right knee, not elsewhere classified  Muscle spasm of calf  Muscle weakness (generalized)  Acute pain of right knee  Other symptoms and signs involving the musculoskeletal system  Rationale  for Evaluation and Treatment: Rehabilitation  ONSET DATE: ~10 years, started worsening 3 years ago  SUBJECTIVE:   SUBJECTIVE STATEMENT: Pt states she wasn't too sore after last session. Notes minimal pain this morning. Some aching last night. Pt states she did start a new arthritis pill.   PERTINENT HISTORY: Bilat knee arthritis  PAIN:  Are you having pain? Yes: NPRS scale: currently 2, at worst 8/10 Pain location: R calf Pain description: dull, aching Aggravating factors: Sitting for prolonged periods (~20 min) Relieving factors: Moving  PRECAUTIONS: None  WEIGHT BEARING RESTRICTIONS: No  FALLS:  Has patient fallen in last 6 months? No  LIVING ENVIRONMENT: Lives with: lives with their spouse Lives in: House/apartment Stairs: No Has following equipment at home: None  OCCUPATION: Retired - likes biking and walking; plans to volunteer  PLOF: Independent  PATIENT GOALS: Get rid of pain  NEXT MD VISIT: n/a  OBJECTIVE:   DIAGNOSTIC FINDINGS: Nothing recent on file  PATIENT SURVEYS:  LEFS 98.8%  COGNITION: Overall cognitive status: Within functional limits for tasks assessed     MUSCLE LENGTH: Hamstrings: Right 90 deg; Left 90 deg Thomas test: Right 10 deg; Left 10 deg  POSTURE: weight shift left and Right knee slightly flexed  PALPATION: TTP R gastroc/soleus  LOWER EXTREMITY ROM:  Active ROM Right eval Left eval  Hip flexion    Hip extension    Hip  abduction    Hip adduction    Hip internal rotation    Hip external rotation    Knee flexion 120 135  Knee extension -14 -5  Ankle dorsiflexion    Ankle plantarflexion    Ankle inversion    Ankle eversion     (Blank rows = not tested)  LOWER EXTREMITY MMT:  MMT Right eval Left eval  Hip flexion 3+ 3+  Hip extension 3+ 4-  Hip abduction 3 4-  Hip adduction    Hip internal rotation 4 4  Hip external rotation 3+ 4  Knee flexion 4+ 4+  Knee extension 5 5  Ankle dorsiflexion 4 5  Ankle  plantarflexion 5 5  Ankle inversion    Ankle eversion     (Blank rows = not tested)  LOWER EXTREMITY SPECIAL TESTS:  Hip special tests: Luisa Hart (FABER) test: negative and Thomas test: negative Tighter in L hip during FABER  FUNCTIONAL TESTS:  Single leg heel raise normal bilat  GAIT: Distance walked: 100' Assistive device utilized: None Level of assistance: Complete Independence Comments: R knee more flexed than L during stance phase   TODAY'S TREATMENT:    [facility] Adult PT Treatment:                                                DATE: 08/08/22 Therapeutic Exercise: Nustep L6 x 5 min LEs/UEs Slant board gastroc stretch 2x 30 sec Seated hamstring stretch 2x 30 sec Standing runner's stretch into heel raises 2x10 Standing terminal knee extension into ball 2x10 S/L clamshell red TB 2x10 S/L hip ext on diagonal red TB around knees 2x10 Sit<>stand red TB around knees 2x10 Side step red TB around ankles 2x10 Standing hamstring curl red TB 2x10   [facility] Adult PT Treatment:                                                DATE: 08/06/2022 Therapeutic Exercise: Gastroc & soleus stretches on slant board 2x30" each Supine isometric hip add (ball squeeze) 10x5" & hip abd (belt press) 10x5" Resisted clamshell RTB x15 B Resisted hip ext on diagonal x15 B Prone quad stretch w/strap 3x30" B Standing resisted hip abd GTB crossed at ankles Resisted side stepping GTB Fwd/bkwd resisted monster walks GTB Standing resisted HS curls YTB                                                                                                                             PATIENT EDUCATION:  Education details: Exam findings, POC, initial HEP Person educated: Patient Education method: Explanation, Demonstration, and Handouts Education comprehension: verbalized understanding, returned demonstration, and needs further education  HOME EXERCISE PROGRAM: Access  Code: [email or web address] URL:  [email or web address] Date: 08/06/2022 Prepared by: Carlynn Herald  Exercises - Clamshell with Resistance  - 1 x daily - 7 x weekly - 3 sets - 10 reps - 3 sec hold - Supine Active Straight Leg Raise  - 1 x daily - 7 x weekly - 3 sets - 10 reps - Hip Extension with Resistance Loop  - 1 x daily - 7 x weekly - 3 sets - 10 reps - Gastroc Stretch on Wall  - 1 x daily - 7 x weekly - 2 sets - 30 sec hold - Standing Soleus Stretch on Foam 1/2 Roll  - 1 x daily - 7 x weekly - 2 sets - 30 sec hold - Seated Hamstring Stretch  - 1 x daily - 7 x weekly - 2 sets - 30 sec hold - Side Stepping with Resistance at Ankles and Counter Support  - 1 x daily - 7 x weekly - 3 sets - 10 reps - Standing Hamstring Curl with Resistance  - 1 x daily - 7 x weekly - 3 sets - 10 reps  ASSESSMENT:  CLINICAL IMPRESSION: Continued to work on knee and hip strengthening. Difficulty coordinating terminal knee extension without hip compensation. Focused primarily on gastroc and quad strengthening with R knee as extended as possible. Good tolerance to activity.    OBJECTIVE IMPAIRMENTS: decreased activity tolerance, decreased mobility, decreased ROM, decreased strength, increased fascial restrictions, increased muscle spasms, impaired flexibility, improper body mechanics, and pain.    GOALS: Goals reviewed with patient? Yes  SHORT TERM GOALS: Target date: 08/28/2022  Pt will be ind with initial HEP Baseline: Goal status: INITIAL   LONG TERM GOALS: Target date: 09/25/2022  Pt will be ind with management and progression of HEP Baseline:  Goal status: INITIAL  2.  Pt will demo improved R knee ROM from -5 to 125 deg Baseline:  Goal status: INITIAL  3.  Pt will report >/=75% improvement in her pain Baseline:  Goal status: INITIAL  4.  Pt will demo at least 4/5 bilat hip strength for improved knee stability and endurance Baseline:  Goal status: INITIAL  5.  Pt will have LEFs of 80/80 Baseline:  Goal  status: INITIAL    PLAN:  PT FREQUENCY: 2x/week  PT DURATION: 8 weeks  PLANNED INTERVENTIONS: Therapeutic exercises, Therapeutic activity, Neuromuscular re-education, Balance training, Gait training, Patient/Family education, Self Care, Joint mobilization, Stair training, Aquatic Therapy, Dry Needling, Electrical stimulation, Cryotherapy, Moist heat, Ultrasound, Ionotophoresis 4mg /ml Dexamethasone, Manual therapy, and Re-evaluation  PLAN FOR NEXT SESSION: Assess response to HEP. Work on Academic librarian. Hip strengthening. Single leg stability. Knee ROM/terminal knee ext on R.    Elford Evilsizer April Ma L Yobani Schertzer, PT 08/08/2022, 10:10 AM

## 2022-08-13 ENCOUNTER — Ambulatory Visit: Payer: Medicare Other

## 2022-08-15 ENCOUNTER — Ambulatory Visit: Payer: Medicare Other | Admitting: Physical Therapy

## 2022-08-20 ENCOUNTER — Encounter: Payer: Self-pay | Admitting: Physical Therapy

## 2022-08-20 ENCOUNTER — Ambulatory Visit: Payer: Medicare Other | Attending: Sports Medicine | Admitting: Physical Therapy

## 2022-08-20 DIAGNOSIS — R29898 Other symptoms and signs involving the musculoskeletal system: Secondary | ICD-10-CM | POA: Diagnosis present

## 2022-08-20 DIAGNOSIS — M62831 Muscle spasm of calf: Secondary | ICD-10-CM | POA: Diagnosis present

## 2022-08-20 DIAGNOSIS — M6281 Muscle weakness (generalized): Secondary | ICD-10-CM | POA: Insufficient documentation

## 2022-08-20 DIAGNOSIS — M25661 Stiffness of right knee, not elsewhere classified: Secondary | ICD-10-CM | POA: Insufficient documentation

## 2022-08-20 DIAGNOSIS — M25561 Pain in right knee: Secondary | ICD-10-CM | POA: Insufficient documentation

## 2022-08-20 NOTE — Therapy (Signed)
OUTPATIENT PHYSICAL THERAPY LOWER EXTREMITY TREATMENT AND DISCHARGE  PHYSICAL THERAPY DISCHARGE SUMMARY  Visits from Start of Care: 4  Current functional level related to goals / functional outcomes: See below   Remaining deficits: See below   Education / Equipment: See below   Patient agrees to discharge. Patient goals were partially met. Patient is being discharged due to being pleased with the current functional level.    Patient Name: Teresa Morton MRN: [id number] DOB:04-10-1955, 67 y.o., female Today's Date: 08/20/2022  END OF SESSION:  PT End of Session - 08/20/22 1059     Visit Number 4    Number of Visits 16    Date for PT Re-Evaluation 09/25/22    Authorization Type Medicare    Progress Note Due on Visit 10    PT Start Time 1100    PT Stop Time 1140    PT Time Calculation (min) 40 min    Activity Tolerance Patient tolerated treatment well    Behavior During Therapy WFL for tasks assessed/performed              Past Medical History:  Diagnosis Date   Arthritis    OA bil knees   Melanoma in situ of trunk (HCC)    Past Surgical History:  Procedure Laterality Date   CHOLECYSTECTOMY     CHOLECYSTECTOMY, LAPAROSCOPIC  2005   KIDNEY STONE SURGERY     KNEE ARTHROSCOPY Left 04/2020   KNEE ARTHROSCOPY WITH MEDIAL MENISECTOMY Right 10/12/2020   Procedure: RIGHT KNEE ARTHROSCOPY WITH MEDIAL MENISECTOMY;  Surgeon: Bjorn Pippin, MD;  Location: [facility];  Service: Orthopedics;  Laterality: Right;   SALPINGOOPHORECTOMY  2007   Patient Active Problem List   Diagnosis Date Noted   Primary osteoarthritis of both knees 10/07/2019   Lateral epicondylitis of right elbow 10/07/2019   Hepatic lesion 02/15/2019   Uterine fibroid 12/03/2018   Abdominal fullness 12/02/2018   Dark urine 12/02/2018   Left nephrolithiasis 10/17/2017   Postmenopausal 09/25/2017   History of kidney stones 09/25/2017   Melanoma in situ of trunk (HCC) 09/25/2017    Hemorrhoids 09/25/2017    PCP: Arva Chafe  REFERRING PROVIDER: Elige Ko., MD  REFERRING DIAG: [id number] (ICD-10-CM) - Pain in right knee  THERAPY DIAG:  Stiffness of right knee, not elsewhere classified  Muscle spasm of calf  Muscle weakness (generalized)  Acute pain of right knee  Other symptoms and signs involving the musculoskeletal system  Rationale for Evaluation and Treatment: Rehabilitation  ONSET DATE: ~10 years, started worsening 3 years ago  SUBJECTIVE:   SUBJECTIVE STATEMENT: Pt reports she's been doing good. Feels ready to d/c from PT. Has been good with doing her own. Has not been taking any medication for pain.   PERTINENT HISTORY: Bilat knee arthritis  PAIN:  Are you having pain? Yes: NPRS scale: currently 0, no pain within the past week/10 Pain location: R calf Pain description: dull, aching Aggravating factors: Sitting for prolonged periods (~20 min) Relieving factors: Moving  PRECAUTIONS: None  WEIGHT BEARING RESTRICTIONS: No  FALLS:  Has patient fallen in last 6 months? No  LIVING ENVIRONMENT: Lives with: lives with their spouse Lives in: House/apartment Stairs: No Has following equipment at home: None  OCCUPATION: Retired - likes biking and walking; plans to volunteer  PLOF: Independent  PATIENT GOALS: Get rid of pain  NEXT MD VISIT: n/a  OBJECTIVE:   DIAGNOSTIC FINDINGS: Nothing recent on file  PATIENT SURVEYS:  LEFS 98.8%  COGNITION: Overall  cognitive status: Within functional limits for tasks assessed     MUSCLE LENGTH: Hamstrings: Right 90 deg; Left 90 deg Thomas test: Right 10 deg; Left 10 deg  POSTURE: weight shift left and Right knee slightly flexed  PALPATION: TTP R gastroc/soleus  LOWER EXTREMITY ROM:  Active ROM Right eval Left eval Right 08/20/22 Left 08/20/22  Hip flexion      Hip extension      Hip abduction      Hip adduction      Hip internal rotation      Hip external rotation       Knee flexion 120 135 130 130  Knee extension -14 -5 -10 -5  Ankle dorsiflexion      Ankle plantarflexion      Ankle inversion      Ankle eversion       (Blank rows = not tested)  LOWER EXTREMITY MMT:  MMT Right eval Left eval Right 08/20/22 Left 08/20/22   Hip flexion 3+ 3+ 5 5  Hip extension 3+ 4- 5 5  Hip abduction 3 4- 4 5  Hip adduction      Hip internal rotation 4 4 5 5   Hip external rotation 3+ 4 4 4+  Knee flexion 4+ 4+ 5 5  Knee extension 5 5 5 5   Ankle dorsiflexion 4 5    Ankle plantarflexion 5 5    Ankle inversion      Ankle eversion       (Blank rows = not tested)  LOWER EXTREMITY SPECIAL TESTS:  Hip special tests: Luisa Hart (FABER) test: negative and Thomas test: negative Tighter in L hip during FABER  FUNCTIONAL TESTS:  Single leg heel raise normal bilat  GAIT: Distance walked: 100' Assistive device utilized: None Level of assistance: Complete Independence Comments: R knee more flexed than L during stance phase   TODAY'S TREATMENT:    [facility] Adult PT Treatment:                                                DATE: 08/20/22 Therapeutic Exercise: Nustep L5 x 5 min LEs/UEs Slant board gastroc stretch 2x 30 sec Seated hamstring stretch 2x 30 sec Therapeutic Activity: Rechecking goals   [facility] Adult PT Treatment:                                                DATE: 08/08/22 Therapeutic Exercise: Nustep L6 x 5 min LEs/UEs Slant board gastroc stretch 2x 30 sec Seated hamstring stretch 2x 30 sec Standing runner's stretch into heel raises 2x10 Standing terminal knee extension into ball 2x10 S/L clamshell red TB 2x10 S/L hip ext on diagonal red TB around knees 2x10 Sit<>stand red TB around knees 2x10 Side step red TB around ankles 2x10 Standing hamstring curl red TB 2x10   [facility] Adult PT Treatment:                                                DATE: 08/06/2022 Therapeutic Exercise: Gastroc & soleus stretches on slant board 2x30" each Supine isometric hip  add (ball squeeze)  10x5" & hip abd (belt press) 10x5" Resisted clamshell RTB x15 B Resisted hip ext on diagonal x15 B Prone quad stretch w/strap 3x30" B Standing resisted hip abd GTB crossed at ankles Resisted side stepping GTB Fwd/bkwd resisted monster walks GTB Standing resisted HS curls YTB                                                                                                                             PATIENT EDUCATION:  Education details: Exam findings, POC, initial HEP Person educated: Patient Education method: Explanation, Demonstration, and Handouts Education comprehension: verbalized understanding, returned demonstration, and needs further education  HOME EXERCISE PROGRAM: Access Code: [email or web address] URL: [email or web address] Date: 08/06/2022 Prepared by: Carlynn Herald  Exercises - Clamshell with Resistance  - 1 x daily - 7 x weekly - 3 sets - 10 reps - 3 sec hold - Supine Active Straight Leg Raise  - 1 x daily - 7 x weekly - 3 sets - 10 reps - Hip Extension with Resistance Loop  - 1 x daily - 7 x weekly - 3 sets - 10 reps - Gastroc Stretch on Wall  - 1 x daily - 7 x weekly - 2 sets - 30 sec hold - Standing Soleus Stretch on Foam 1/2 Roll  - 1 x daily - 7 x weekly - 2 sets - 30 sec hold - Seated Hamstring Stretch  - 1 x daily - 7 x weekly - 2 sets - 30 sec hold - Side Stepping with Resistance at Ankles and Counter Support  - 1 x daily - 7 x weekly - 3 sets - 10 reps - Standing Hamstring Curl with Resistance  - 1 x daily - 7 x weekly - 3 sets - 10 reps  ASSESSMENT:  CLINICAL IMPRESSION: Pt has been doing well and feels ready for PT d/c. Pt has met or partially met all of her PT goals. Still unable to fully extend knee with hip extension; however, in sitting she is able to get to 5 deg away from neutral. In terms of pain with sitting, pt reports very mild pain compared to the beginning of PT but can still be present. Overall, pt has done well with PT and  has improved her strength and ROM with good understanding of her HEP.   OBJECTIVE IMPAIRMENTS: decreased activity tolerance, decreased mobility, decreased ROM, decreased strength, increased fascial restrictions, increased muscle spasms, impaired flexibility, improper body mechanics, and pain.    GOALS: Goals reviewed with patient? Yes  SHORT TERM GOALS: Target date: 08/28/2022  Pt will be ind with initial HEP Baseline: Goal status: MET   LONG TERM GOALS: Target date: 09/25/2022  Pt will be ind with management and progression of HEP Baseline:  Goal status: MET  2.  Pt will demo improved R knee ROM from -5 to 125 deg Baseline:  08/20/22: See ROM chart above; knee ext is 0 when hip is flexed, with hip extended, knee ext is ~  10 deg away from neutral Goal status: PARTIALLY MET  3.  Pt will report >/=75% improvement in her pain Baseline:  08/20/22: 95% improvement Goal status: MET  4.  Pt will demo at least 4/5 bilat hip strength for improved knee stability and endurance Baseline:  08/20/22: See MMT chart above Goal status: MET   5.  Pt will have LEFs of 80/80 Baseline:  08/20/22: 79/80 = 98.8% Goal status: NOT MET    PLAN:  PT FREQUENCY: 2x/week  PT DURATION: 8 weeks  PLANNED INTERVENTIONS: Therapeutic exercises, Therapeutic activity, Neuromuscular re-education, Balance training, Gait training, Patient/Family education, Self Care, Joint mobilization, Stair training, Aquatic Therapy, Dry Needling, Electrical stimulation, Cryotherapy, Moist heat, Ultrasound, Ionotophoresis 4mg /ml Dexamethasone, Manual therapy, and Re-evaluation  PLAN FOR NEXT SESSION: Assess response to HEP. Work on Academic librarian. Hip strengthening. Single leg stability. Knee ROM/terminal knee ext on R.    Shaili Donalson April Ma L Danyael Alipio, PT 08/20/2022, 11:00 AM

## 2022-08-23 ENCOUNTER — Ambulatory Visit: Payer: Medicare Other

## 2022-08-27 ENCOUNTER — Encounter: Payer: Medicare Other | Admitting: Physical Therapy

## 2022-08-29 ENCOUNTER — Encounter: Payer: Medicare Other | Admitting: Physical Therapy

## 2022-09-03 ENCOUNTER — Encounter: Payer: Medicare Other | Admitting: Physical Therapy

## 2022-09-05 ENCOUNTER — Encounter: Payer: Medicare Other | Admitting: Physical Therapy

## 2022-09-10 ENCOUNTER — Encounter: Payer: Medicare Other | Admitting: Physical Therapy

## 2022-09-12 ENCOUNTER — Encounter: Payer: Medicare Other | Admitting: Physical Therapy

## 2023-04-02 NOTE — Therapy (Signed)
OUTPATIENT PHYSICAL THERAPY THORACOLUMBAR EVALUATION   Patient Name: Teresa Morton MRN: [id number] DOB:[birth year],68 y.o., female Today's Date: 04/03/2023   END OF SESSION:  PT End of Session - 04/03/23 0928     Visit Number 1    Number of Visits 13    Date for PT Re-Evaluation 05/17/23    Authorization Type MCR    Progress Note Due on Visit 10    PT Start Time 0929    PT Stop Time 1015    PT Time Calculation (min) 46 min    Activity Tolerance Patient tolerated treatment well              Past Medical History:  Diagnosis Date   Arthritis    OA bil knees   Melanoma in situ of trunk (HCC)    Past Surgical History:  Procedure Laterality Date   CHOLECYSTECTOMY     CHOLECYSTECTOMY, LAPAROSCOPIC  2005   KIDNEY STONE SURGERY     KNEE ARTHROSCOPY Left 04/2020   KNEE ARTHROSCOPY WITH MEDIAL MENISECTOMY Right 10/12/2020   Procedure: RIGHT KNEE ARTHROSCOPY WITH MEDIAL MENISECTOMY;  Surgeon: Bjorn Pippin, MD;  Location: [facility];  Service: Orthopedics;  Laterality: Right;   SALPINGOOPHORECTOMY  2007   Patient Active Problem List   Diagnosis Date Noted   Primary osteoarthritis of both knees 10/07/2019   Lateral epicondylitis of right elbow 10/07/2019   Hepatic lesion 02/15/2019   Uterine fibroid 12/03/2018   Abdominal fullness 12/02/2018   Dark urine 12/02/2018   Left nephrolithiasis 10/17/2017   Postmenopausal 09/25/2017   History of kidney stones 09/25/2017   Melanoma in situ of trunk (HCC) 09/25/2017   Hemorrhoids 09/25/2017    PCP: Sharlene Dory, DO   REFERRING PROVIDER: Ashley Mariner, FNP   REFERRING DIAG: chronic Rt sided low back pain without sciatica.   Rationale for Evaluation and Treatment: Rehabilitation  THERAPY DIAG:  Other low back pain  Pain in right hip  Muscle weakness (generalized)  ONSET DATE: January 2025   SUBJECTIVE:                                                                                                                                                                                            SUBJECTIVE STATEMENT: She always has had trouble with her back and always feels stiff.  Patient reports she was raking leaves about a month and felt like she had a muscle spasm in her back and was prescribed a muscle relaxer, which helped with this pain. Then about 3 weeks ago she was playing with her grandkids and turned and felt like she pulled  a muscle along the Rt side of her low back/hip She has not been able to fully get rid of the pain that started 3 weeks ago along the Rt side of the back, but exercise (aerobics) seems to make it feel better. The pain is getting better since onset, but will still come and go. She denies any numbness/tingling. She has a history of chronic urge incontinence, but denies any acute changes in bowel/bladder symptoms.   PERTINENT HISTORY:  Bilateral knee arthroscopy- medial menisectomy Rt  Kidney stone surgery  Chronic urge incontinence  Osteoporosis   PAIN:  Are you having pain? Yes: NPRS scale: 2 currently; at worst 7 Pain location: Rt low back/posterior hip Pain description: tight,ache Aggravating factors: rotation Relieving factors: exercise (aerobics)   PRECAUTIONS: None  WEIGHT BEARING RESTRICTIONS: No  FALLS:  Has patient fallen in last 6 months? No  LIVING ENVIRONMENT: Lives with: lives with their spouse Lives in: House/apartment Stairs: Yes: Internal: flight steps; on left going up Has following equipment at home: None  OCCUPATION: volunteer at [employer]   PLOF: Independent  PATIENT GOALS: "I want to get this pain to subside."    OBJECTIVE:  Note: Objective measures were completed at Evaluation unless otherwise noted.  DIAGNOSTIC FINDINGS:  None   PATIENT SURVEYS:  Modified Oswestry 7/50; 14% disability    SCREENING FOR RED FLAGS: Bowel or bladder incontinence: patient has known chronic urge incontinence and loose  stools that is known to her PCP with GI and urology following.  Cauda equina syndrome: No   COGNITION: Overall cognitive status: Within functional limits for tasks assessed     SENSATION: WFL  MUSCLE LENGTH: Hamstrings: Right lacking 40 deg; Left lacking 40 deg   POSTURE: decreased lumbar lordosis and decreased thoracic kyphosis  PALPATION: TTP Rt posterior gluteals, piriformis , Rt lumbar paraspinals   LUMBAR ROM:   AROM eval  Flexion Full   Extension WFL  Right lateral flexion 25% limited  Left lateral flexion 25% limited   Right rotation 25% limited  Left rotation 25% limited    (Blank rows = not tested)  LOWER EXTREMITY ROM:     PROM  Right eval Left eval  Hip flexion WFL   Hip extension    Hip abduction WFL   Hip adduction    Hip internal rotation WFL   Hip external rotation [facility]   Knee flexion    Knee extension    Ankle dorsiflexion    Ankle plantarflexion    Ankle inversion    Ankle eversion     (Blank rows = not tested)  LOWER EXTREMITY MMT:    MMT Right eval Left eval  Hip flexion 4+ 4+  Hip extension 4 4  Hip abduction 4- 4  Hip adduction    Hip internal rotation    Hip external rotation    Knee flexion    Knee extension    Ankle dorsiflexion    Ankle plantarflexion    Ankle inversion    Ankle eversion     (Blank rows = not tested)  LUMBAR SPECIAL TESTS:  (-) SLR  (+) FABER (-) Sacral Thrust  (-) Thigh Thrust   FUNCTIONAL TESTS:  Not tested   GAIT: Distance walked: 10 ft  Assistive device utilized: None Level of assistance: Complete Independence Comments: no obvious gait impairments   [facility] Adult PT Treatment:  DATE: 04/03/23  Therapeutic Exercise: Demonstrated, performed and issued initial HEP.   Manual Therapy: Skilled palpation of trigger points   Self Care: Modalities for pain control (heat)  Trigger Point Dry Needling  Initial Treatment: Pt instructed on Dry  Needling rational, procedures, and possible side effects. Pt instructed to expect mild to moderate muscle soreness later in the day and/or into the next day.  Pt instructed in methods to reduce muscle soreness. Pt instructed to continue prescribed HEP. Patient was educated on signs and symptoms of infection and other risk factors and advised to seek medical attention should they occur.  Patient verbalized understanding of these instructions and education.   Patient Verbal Consent Given: Yes Education Handout Provided: Yes Muscles Treated: Rt glute max/med  Electrical Stimulation Performed: No Treatment Response/Outcome: patient reported deep twitch response      PATIENT EDUCATION:  Education details: see treatment Person educated: Patient Education method: Explanation, Demonstration, Tactile cues, Verbal cues, and Handouts Education comprehension: verbalized understanding, returned demonstration, verbal cues required, tactile cues required, and needs further education  HOME EXERCISE PROGRAM: Access Code: [email or web address] URL: [email or web address] Date: 04/03/2023 Prepared by: Letitia Libra  Exercises - Seated Hamstring Stretch  - 1 x daily - 7 x weekly - 3 sets - 30 sec hold - Supine Lower Trunk Rotation  - 1 x daily - 7 x weekly - 2 sets - 10 reps - Supine Figure 4 Piriformis Stretch  - 1 x daily - 7 x weekly - 3 sets - 30  hold - Sidelying Thoracic Rotation with Open Book  - 1 x daily - 7 x weekly - 1 sets - 10 reps - Clamshell  - 1 x daily - 7 x weekly - 2 sets - 10 reps  ASSESSMENT:  CLINICAL IMPRESSION: Patient is a 68 y.o. female who was seen today for physical therapy evaluation and treatment for chronic Rt sided low back pain without sciatica. Her pain is localized to the Rt low back/posterior hip with palpable tenderness and tautness about Rt gluteals and lumbar paraspinals. She has mild limitation into trunk lateral flexion and rotation, but has pain free ROM in  all planes today. She has mild hip weakness and tight hamstrings bilaterally. TPDN was performed to gluteals today and HEP was issued to include trunk mobility, hip stretches, and gluteal strengthening. She will benefit from skilled PT to address the above stated deficits in order to optimize her function and assist with overall pain reduction.   OBJECTIVE IMPAIRMENTS: decreased knowledge of condition, decreased ROM, decreased strength, increased fascial restrictions, impaired flexibility, postural dysfunction, and pain.   ACTIVITY LIMITATIONS: sitting, transfers, and reach over head  PARTICIPATION LIMITATIONS:  does limit participate, participates despite pain  PERSONAL FACTORS: Age, Fitness, Time since onset of injury/illness/exacerbation, and 3+ comorbidities: see PMH abve  are also affecting patient's functional outcome.   REHAB POTENTIAL: Good  CLINICAL DECISION MAKING: Stable/uncomplicated  EVALUATION COMPLEXITY: Low   GOALS: Goals reviewed with patient? Yes  SHORT TERM GOALS: Target date: 04/24/2023    Patient will be independent and compliant with initial HEP.   Baseline: see above Goal status: INITIAL  2.  Patient will report pain at worst rated as </=4/10 to improve quality of life.  Baseline: 7 Goal status: INITIAL  3.  Patient will improve hamstring length by 10 degrees bilaterally to reduce stress on her back.  Baseline: see above  Goal status: INITIAL   LONG TERM GOALS: Target date: 05/17/23  Patient will be able to tolerate  sitting for at least an hour with minimal/no pain.  Baseline: 30 minutes  Goal status: INITIAL  2.  Patient will demonstrate 5/5 bilateral hip strength to improve lumbopelvic stability.  Baseline: see above Goal status: INITIAL  3.  Patient will report no pain with reaching/rotational activity.  Baseline: 7 Goal status: INITIAL  4.  Patient will be independent with advanced home program to progress/maintain current level of function.   Baseline: initial HEP issued  Goal status: INITIAL    PLAN:  PT FREQUENCY: 2x/week  PT DURATION: 6 weeks  PLANNED INTERVENTIONS: 97164- PT Re-evaluation, 97110-Therapeutic exercises, 97530- Therapeutic activity, [id number]- Neuromuscular re-education, 97535- Self Care, [id number]- Manual therapy, [id number]- Aquatic Therapy, 97014- Electrical stimulation (unattended), [id number]- Electrical stimulation (manual), Taping, Dry Needling, Cryotherapy, and Moist heat.  PLAN FOR NEXT SESSION: review and progress HEP prn; tpdn response; manual to lumbar/Rt posterior hip; trunk mobility (mindful of osteoporosis), hip strengthening   Letitia Libra, PT, DPT, ATC 04/03/23 12:55 PM

## 2023-04-03 ENCOUNTER — Other Ambulatory Visit: Payer: Self-pay

## 2023-04-03 ENCOUNTER — Other Ambulatory Visit: Payer: Self-pay | Admitting: Family Medicine

## 2023-04-03 ENCOUNTER — Ambulatory Visit: Payer: Medicare Other | Attending: Family Medicine

## 2023-04-03 DIAGNOSIS — M6281 Muscle weakness (generalized): Secondary | ICD-10-CM | POA: Diagnosis present

## 2023-04-03 DIAGNOSIS — M5459 Other low back pain: Secondary | ICD-10-CM | POA: Insufficient documentation

## 2023-04-03 DIAGNOSIS — M25551 Pain in right hip: Secondary | ICD-10-CM | POA: Insufficient documentation

## 2023-04-03 DIAGNOSIS — Z1231 Encounter for screening mammogram for malignant neoplasm of breast: Secondary | ICD-10-CM

## 2023-04-03 NOTE — Patient Instructions (Signed)

## 2023-04-08 ENCOUNTER — Ambulatory Visit: Payer: Medicare Other

## 2023-04-08 DIAGNOSIS — M6281 Muscle weakness (generalized): Secondary | ICD-10-CM

## 2023-04-08 DIAGNOSIS — M25551 Pain in right hip: Secondary | ICD-10-CM

## 2023-04-08 DIAGNOSIS — M5459 Other low back pain: Secondary | ICD-10-CM

## 2023-04-08 NOTE — Therapy (Addendum)
 OUTPATIENT PHYSICAL THERAPY THORACOLUMBAR TREATMENT PHYSICAL THERAPY DISCHARGE SUMMARY  Visits from Start of Care: 2  Current functional level related to goals / functional outcomes: No formal re-assessment of goals    Remaining deficits: Status unknown   Education / Equipment: N/A   Patient agrees to discharge. Patient goals were not met. Patient is being discharged due to not returning since the last visit.    Patient Name: Teresa Morton MRN: [id number] DOB:08-21-1955,68 y.o., female Today's Date: 04/08/2023   END OF SESSION:  PT End of Session - 04/08/23 1101     Visit Number 2    Number of Visits 13    Date for PT Re-Evaluation 05/17/23    Authorization Type MCR    Progress Note Due on Visit 10    PT Start Time 1101    PT Stop Time 1142    PT Time Calculation (min) 41 min    Activity Tolerance Patient tolerated treatment well              Past Medical History:  Diagnosis Date   Arthritis    OA bil knees   Melanoma in situ of trunk (HCC)    Past Surgical History:  Procedure Laterality Date   CHOLECYSTECTOMY     CHOLECYSTECTOMY, LAPAROSCOPIC  2005   KIDNEY STONE SURGERY     KNEE ARTHROSCOPY Left 04/2020   KNEE ARTHROSCOPY WITH MEDIAL MENISECTOMY Right 10/12/2020   Procedure: RIGHT KNEE ARTHROSCOPY WITH MEDIAL MENISECTOMY;  Surgeon: Micheline Ahr, MD;  Location: [facility];  Service: Orthopedics;  Laterality: Right;   SALPINGOOPHORECTOMY  2007   Patient Active Problem List   Diagnosis Date Noted   Primary osteoarthritis of both knees 10/07/2019   Lateral epicondylitis of right elbow 10/07/2019   Hepatic lesion 02/15/2019   Uterine fibroid 12/03/2018   Abdominal fullness 12/02/2018   Dark urine 12/02/2018   Left nephrolithiasis 10/17/2017   Postmenopausal 09/25/2017   History of kidney stones 09/25/2017   Melanoma in situ of trunk (HCC) 09/25/2017   Hemorrhoids 09/25/2017    PCP: Jobe Mulder, DO   REFERRING  PROVIDER: Eugena Herter, FNP   REFERRING DIAG: chronic Rt sided low back pain without sciatica.   Rationale for Evaluation and Treatment: Rehabilitation  THERAPY DIAG:  Other low back pain  Pain in right hip  Muscle weakness (generalized)  ONSET DATE: January 2025   SUBJECTIVE:                                                                                                                                                                                           SUBJECTIVE  STATEMENT: "I think I am better."  EVAL: She always has had trouble with her back and always feels stiff.  Patient reports she was raking leaves about a month and felt like she had a muscle spasm in her back and was prescribed a muscle relaxer, which helped with this pain. Then about 3 weeks ago she was playing with her grandkids and turned and felt like she pulled a muscle along the Rt side of her low back/hip She has not been able to fully get rid of the pain that started 3 weeks ago along the Rt side of the back, but exercise (aerobics) seems to make it feel better. The pain is getting better since onset, but will still come and go. She denies any numbness/tingling. She has a history of chronic urge incontinence, but denies any acute changes in bowel/bladder symptoms.   PERTINENT HISTORY:  Bilateral knee arthroscopy- medial menisectomy Rt  Kidney stone surgery  Chronic urge incontinence  Osteoporosis   PAIN:  Are you having pain? Yes: NPRS scale: none currently; 4 at worst  Pain location: Rt low back/posterior hip Pain description: tight,ache Aggravating factors: rotation, transitioning from sit to stand Relieving factors: exercise (aerobics)   PRECAUTIONS: None  WEIGHT BEARING RESTRICTIONS: No  FALLS:  Has patient fallen in last 6 months? No  LIVING ENVIRONMENT: Lives with: lives with their spouse Lives in: House/apartment Stairs: Yes: Internal: flight steps; on left going up Has following equipment  at home: None  OCCUPATION: volunteer at [employer]   PLOF: Independent  PATIENT GOALS: "I want to get this pain to subside."    OBJECTIVE:  Note: Objective measures were completed at Evaluation unless otherwise noted.  DIAGNOSTIC FINDINGS:  None   PATIENT SURVEYS:  Modified Oswestry 7/50; 14% disability    SCREENING FOR RED FLAGS: Bowel or bladder incontinence: patient has known chronic urge incontinence and loose stools that is known to her PCP with GI and urology following.  Cauda equina syndrome: No   COGNITION: Overall cognitive status: Within functional limits for tasks assessed     SENSATION: WFL  MUSCLE LENGTH: Hamstrings: Right lacking 40 deg; Left lacking 40 deg   POSTURE: decreased lumbar lordosis and decreased thoracic kyphosis  PALPATION: TTP Rt posterior gluteals, piriformis , Rt lumbar paraspinals   LUMBAR ROM:   AROM eval  Flexion Full   Extension WFL  Right lateral flexion 25% limited  Left lateral flexion 25% limited   Right rotation 25% limited  Left rotation 25% limited    (Blank rows = not tested)  LOWER EXTREMITY ROM:     PROM  Right eval Left eval  Hip flexion WFL   Hip extension    Hip abduction WFL   Hip adduction    Hip internal rotation WFL   Hip external rotation [facility]   Knee flexion    Knee extension    Ankle dorsiflexion    Ankle plantarflexion    Ankle inversion    Ankle eversion     (Blank rows = not tested)  LOWER EXTREMITY MMT:    MMT Right eval Left eval  Hip flexion 4+ 4+  Hip extension 4 4  Hip abduction 4- 4  Hip adduction    Hip internal rotation    Hip external rotation    Knee flexion    Knee extension    Ankle dorsiflexion    Ankle plantarflexion    Ankle inversion    Ankle eversion     (Blank rows = not  tested)  LUMBAR SPECIAL TESTS:  (-) SLR  (+) FABER (-) Sacral Thrust  (-) Thigh Thrust   FUNCTIONAL TESTS:  Not tested   GAIT: Distance walked: 10 ft  Assistive device  utilized: None Level of assistance: Complete Independence Comments: no obvious gait impairments  [facility] Adult PT Treatment:                                                DATE: 04/08/23 Therapeutic Exercise: LTR x 1 minute  Figure 4 stretch x 30 sec each  Open book x 10 each  Seated HS stretch x 30 sec each  SKTC x 10  Prone quad stretch x 1 minute each  HEP update Manual Therapy: Demo and returned demo of use of tennis ball for self-soft tissue mobilization  Neuromuscular re-ed: Clamshells green band 2 x 10  Hip bridge 2 x 10  Sidelying hip abduction 2 x 10    [facility] Adult PT Treatment:                                                DATE: 04/03/23  Therapeutic Exercise: Demonstrated, performed and issued initial HEP.   Manual Therapy: Skilled palpation of trigger points   Self Care: Modalities for pain control (heat)  Trigger Point Dry Needling  Initial Treatment: Pt instructed on Dry Needling rational, procedures, and possible side effects. Pt instructed to expect mild to moderate muscle soreness later in the day and/or into the next day.  Pt instructed in methods to reduce muscle soreness. Pt instructed to continue prescribed HEP. Patient was educated on signs and symptoms of infection and other risk factors and advised to seek medical attention should they occur.  Patient verbalized understanding of these instructions and education.   Patient Verbal Consent Given: Yes Education Handout Provided: Yes Muscles Treated: Rt glute max/med  Electrical Stimulation Performed: No Treatment Response/Outcome: patient reported deep twitch response      PATIENT EDUCATION:  Education details: see treatment Person educated: Patient Education method: Explanation, Demonstration, Tactile cues, Verbal cues, and Handouts Education comprehension: verbalized understanding, returned demonstration, verbal cues required, tactile cues required, and needs further education  HOME EXERCISE  PROGRAM: Access Code: [email or web address] URL: [email or web address] Date: 04/08/2023 Prepared by: Forrestine Ike  Exercises - Seated Hamstring Stretch  - 1 x daily - 7 x weekly - 3 sets - 30 sec hold - Supine Lower Trunk Rotation  - 1 x daily - 7 x weekly - 2 sets - 10 reps - Supine Figure 4 Piriformis Stretch  - 1 x daily - 7 x weekly - 3 sets - 30  hold - Sidelying Thoracic Rotation with Open Book  - 1 x daily - 7 x weekly - 1 sets - 10 reps - Clamshell with Resistance  - 1 x daily - 7 x weekly - 2 sets - 10 reps - Prone Quadriceps Stretch with Strap  - 1 x daily - 7 x weekly - 3 sets - 30 sec  hold - Supine Bridge  - 1 x daily - 7 x weekly - 2 sets - 10 reps - Standing Glute Med Mobilization with Small Ball on Wall  - 1 x daily - 7 x weekly - 3  minute hold  ASSESSMENT:  CLINICAL IMPRESSION: Patient arrives without reports of pain. Reviewed HEP with patient requiring minimal cues for setup of hip stretching. Progressed hip strengthening with patient having mild difficulty maintaining lumbopelvic stability with targeted hip abductor strengthening. No reports of pain throughout session.   EVAL: Patient is a 68 y.o. female who was seen today for physical therapy evaluation and treatment for chronic Rt sided low back pain without sciatica. Her pain is localized to the Rt low back/posterior hip with palpable tenderness and tautness about Rt gluteals and lumbar paraspinals. She has mild limitation into trunk lateral flexion and rotation, but has pain free ROM in all planes today. She has mild hip weakness and tight hamstrings bilaterally. TPDN was performed to gluteals today and HEP was issued to include trunk mobility, hip stretches, and gluteal strengthening. She will benefit from skilled PT to address the above stated deficits in order to optimize her function and assist with overall pain reduction.   OBJECTIVE IMPAIRMENTS: decreased knowledge of condition, decreased ROM, decreased strength,  increased fascial restrictions, impaired flexibility, postural dysfunction, and pain.   ACTIVITY LIMITATIONS: sitting, transfers, and reach over head  PARTICIPATION LIMITATIONS: does limit participate, participates despite pain  PERSONAL FACTORS: Age, Fitness, Time since onset of injury/illness/exacerbation, and 3+ comorbidities: see PMH abve are also affecting patient's functional outcome.   REHAB POTENTIAL: Good  CLINICAL DECISION MAKING: Stable/uncomplicated  EVALUATION COMPLEXITY: Low   GOALS: Goals reviewed with patient? Yes  SHORT TERM GOALS: Target date: 04/24/2023    Patient will be independent and compliant with initial HEP.   Baseline: see above Goal status: INITIAL  2.  Patient will report pain at worst rated as </=4/10 to improve quality of life.  Baseline: 7 Goal status: INITIAL  3.  Patient will improve hamstring length by 10 degrees bilaterally to reduce stress on her back.  Baseline: see above  Goal status: INITIAL   LONG TERM GOALS: Target date: 05/17/23  Patient will be able to tolerate sitting for at least an hour with minimal/no pain.  Baseline: 30 minutes  Goal status: INITIAL  2.  Patient will demonstrate 5/5 bilateral hip strength to improve lumbopelvic stability.  Baseline: see above Goal status: INITIAL  3.  Patient will report no pain with reaching/rotational activity.  Baseline: 7 Goal status: INITIAL  4.  Patient will be independent with advanced home program to progress/maintain current level of function.  Baseline: initial HEP issued  Goal status: INITIAL    PLAN:  PT FREQUENCY: 2x/week  PT DURATION: 6 weeks  PLANNED INTERVENTIONS: 97164- PT Re-evaluation, 97110-Therapeutic exercises, 97530- Therapeutic activity, [id number]- Neuromuscular re-education, 97535- Self Care, 16109- Manual therapy, [id number]- Aquatic Therapy, 97014- Electrical stimulation (unattended), [id number]- Electrical stimulation (manual), Taping, Dry Needling, Cryotherapy,  and Moist heat.  PLAN FOR NEXT SESSION: n/a d/c   Margi Edmundson, PT, DPT, ATC 04/08/23 11:43 AM  Apphia Cropley, PT, DPT, ATC 07/17/23 8:17 AM

## 2023-04-10 ENCOUNTER — Ambulatory Visit: Payer: Medicare Other

## 2023-04-15 ENCOUNTER — Ambulatory Visit: Payer: Medicare Other | Admitting: Physical Therapy

## 2023-04-17 ENCOUNTER — Ambulatory Visit: Payer: Medicare Other | Admitting: Physical Therapy

## 2023-04-22 ENCOUNTER — Encounter: Payer: Medicare Other | Admitting: Physical Therapy

## 2023-04-24 ENCOUNTER — Encounter: Payer: Medicare Other | Admitting: Physical Therapy

## 2023-05-28 ENCOUNTER — Ambulatory Visit: Payer: Medicare Other

## 2023-05-28 DIAGNOSIS — Z1231 Encounter for screening mammogram for malignant neoplasm of breast: Secondary | ICD-10-CM

## 2023-08-01 IMAGING — MG MM DIGITAL SCREENING BILAT W/ TOMO AND CAD
8 series · 8 of 24 positions shown · non-contrast
Comparison: Previous exam(s).

CLINICAL DATA: Screening.

EXAM:
DIGITAL SCREENING BILATERAL MAMMOGRAM WITH TOMOSYNTHESIS AND CAD
TECHNIQUE: Bilateral screening digital craniocaudal and mediolateral oblique
mammograms were obtained. Bilateral screening digital breast
tomosynthesis was performed. The images were evaluated with
computer-aided detection.

[R MLO synth-2D]
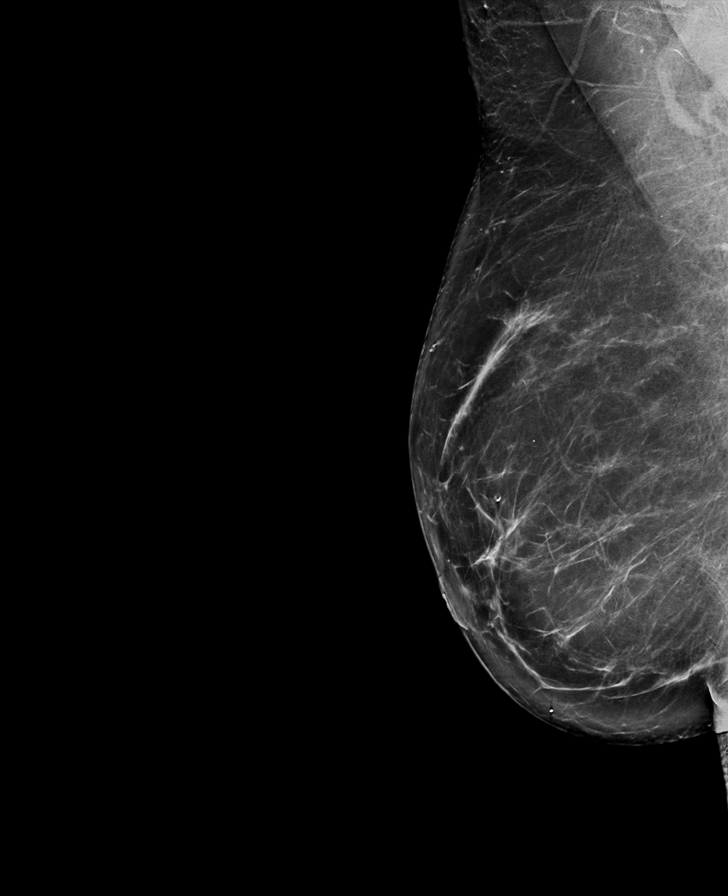

[R CC synth-2D]
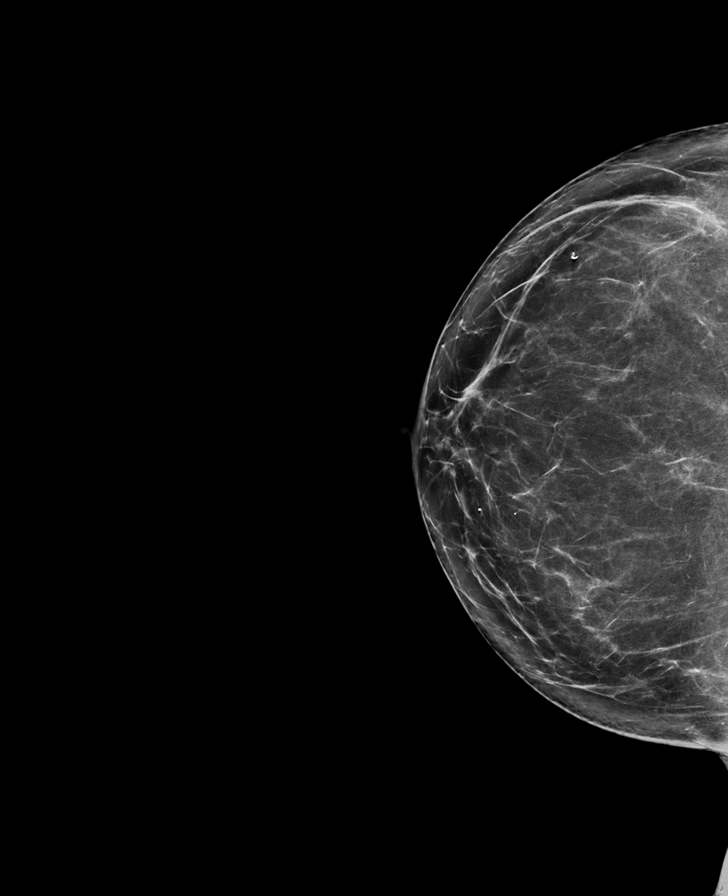

[L MLO synth-2D]
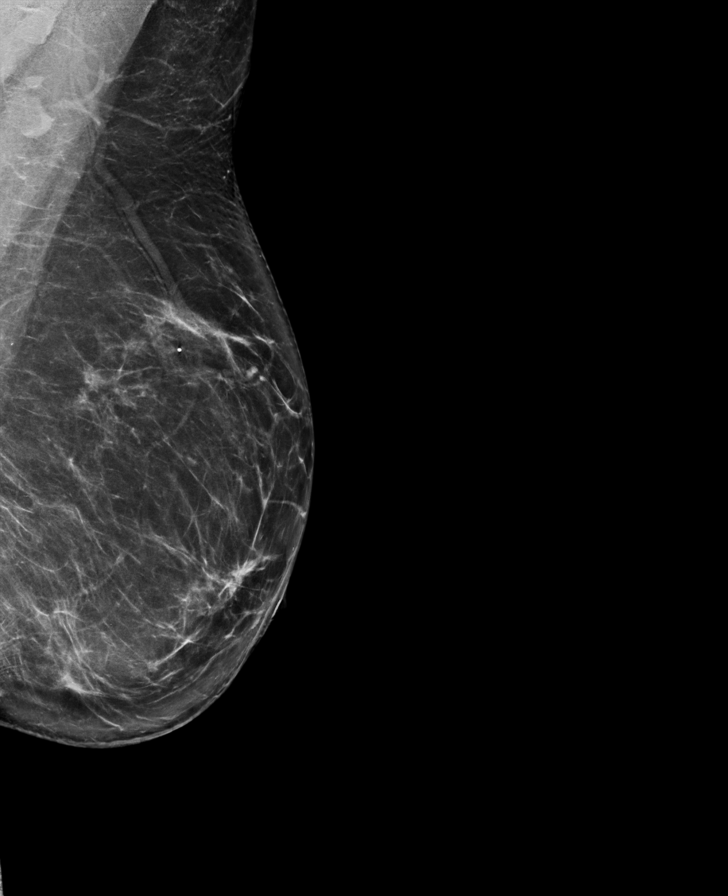

[L CC synth-2D]
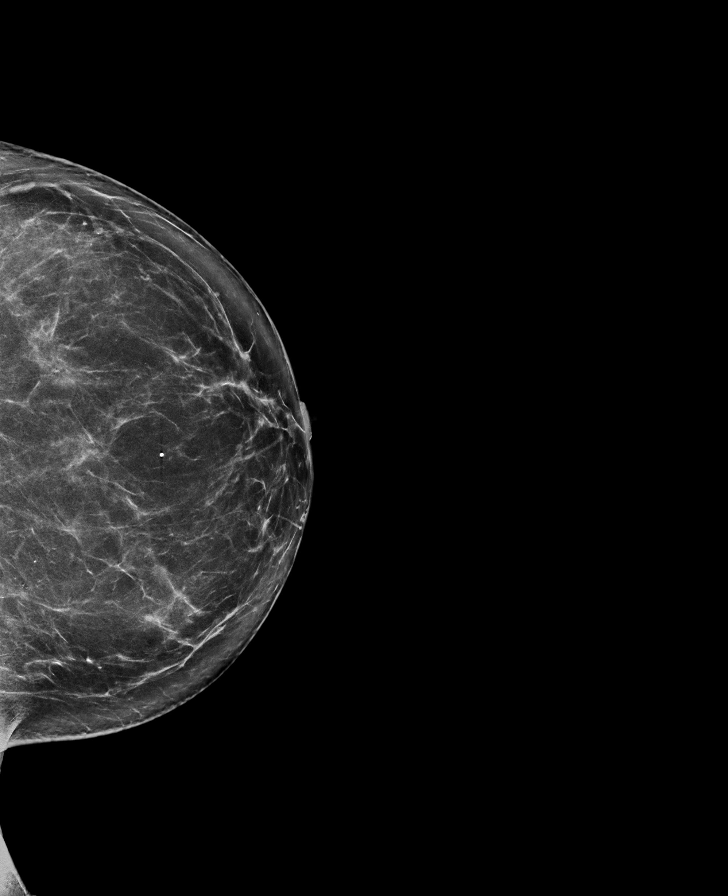

[R MLO tomo · tomo slice 47/93.0]
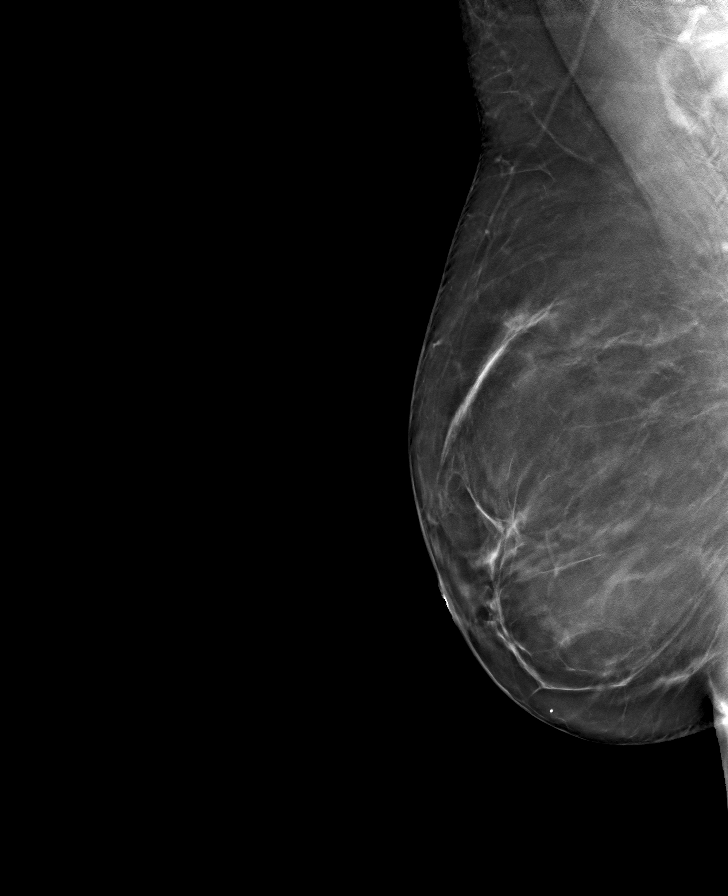

[L MLO tomo · tomo slice 44/87.0]
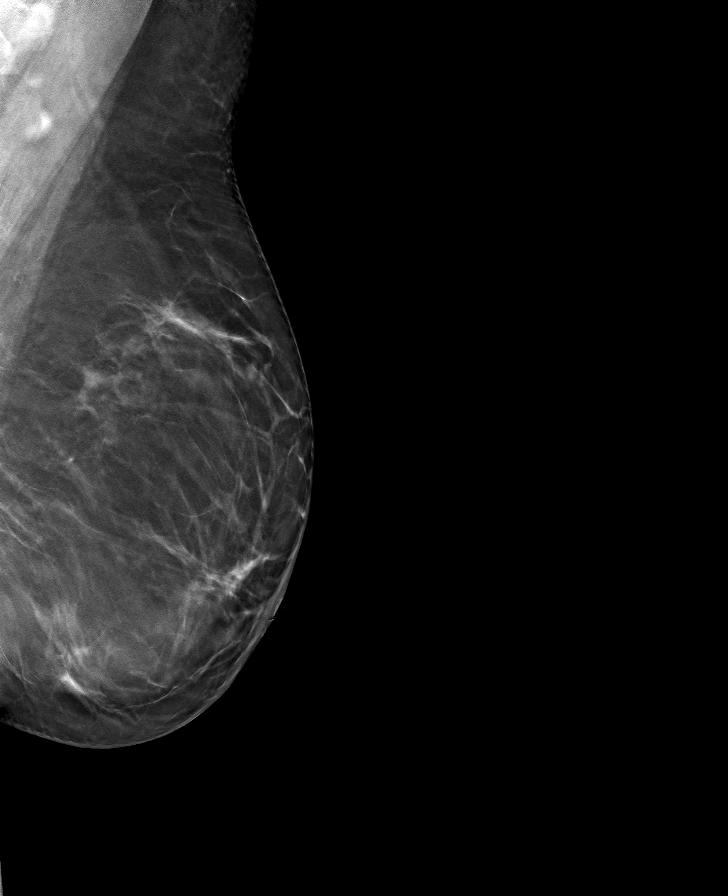

[R CC tomo · tomo slice 41/82.0]
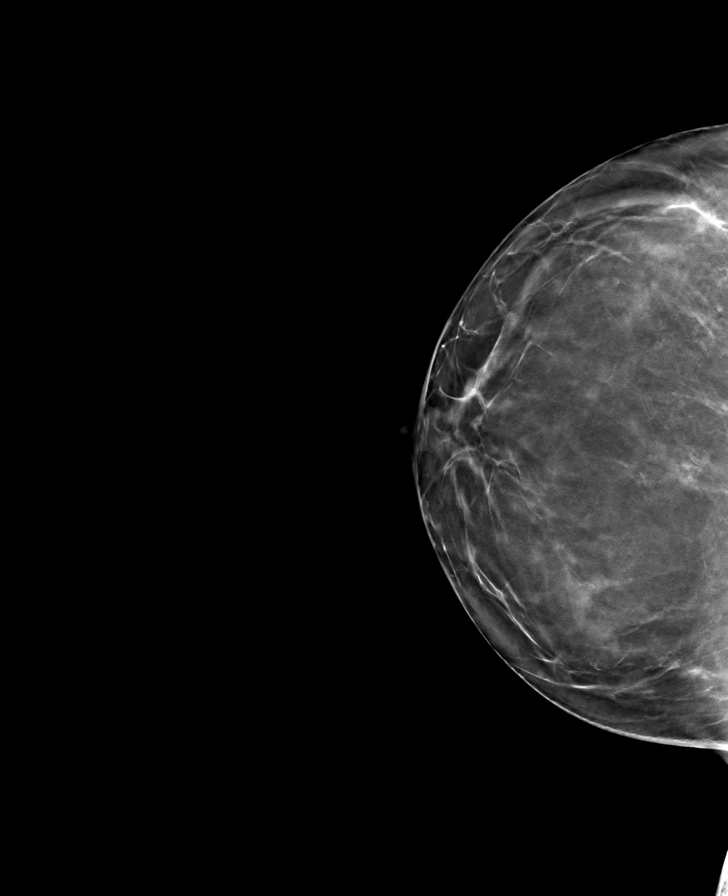

[L CC tomo · tomo slice 42/83.0]
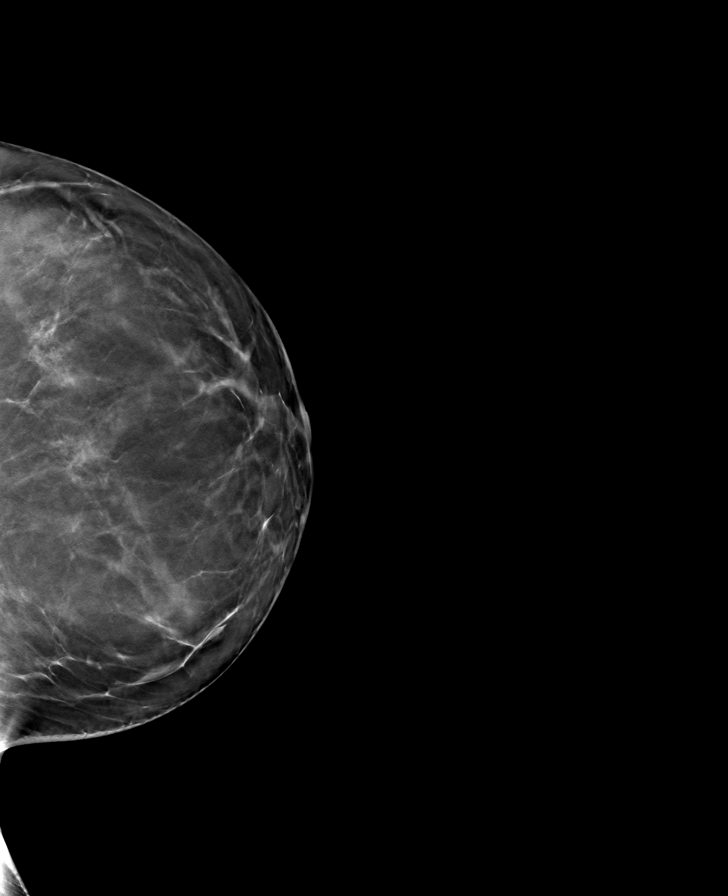

[8 of 24 positions shown; findings below may reference images not displayed]

ACR Breast Density Category b: There are scattered areas of
fibroglandular density.
FINDINGS: There are no findings suspicious for malignancy.
IMPRESSION: No mammographic evidence of malignancy. A result letter of this
screening mammogram will be mailed directly to the patient.

RECOMMENDATION:
Screening mammogram in one year. (Code:51-O-LD2)

BI-RADS CATEGORY  1: Negative.

## 2023-10-21 ENCOUNTER — Encounter: Payer: Self-pay | Admitting: Sports Medicine
